# Patient Record
Sex: Male | Born: 1960
Health system: Southern US, Community
[De-identification: ages and names within clinical notes are randomized; demographics above are authoritative.]

## PROBLEM LIST (undated history)

## (undated) DIAGNOSIS — Z87442 Personal history of urinary calculi: Secondary | ICD-10-CM

## (undated) DIAGNOSIS — R350 Frequency of micturition: Secondary | ICD-10-CM

## (undated) DIAGNOSIS — Z872 Personal history of diseases of the skin and subcutaneous tissue: Secondary | ICD-10-CM

## (undated) DIAGNOSIS — N210 Calculus in bladder: Secondary | ICD-10-CM

## (undated) DIAGNOSIS — Z973 Presence of spectacles and contact lenses: Secondary | ICD-10-CM

## (undated) DIAGNOSIS — R319 Hematuria, unspecified: Secondary | ICD-10-CM

## (undated) DIAGNOSIS — R3915 Urgency of urination: Secondary | ICD-10-CM

## (undated) DIAGNOSIS — N200 Calculus of kidney: Secondary | ICD-10-CM

---
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
## 1994-09-24 HISTORY — PX: CYSTOSCOPY/RETROGRADE/URETEROSCOPY/STONE EXTRACTION WITH BASKET: SHX5317

## 2012-02-06 ENCOUNTER — Encounter (HOSPITAL_COMMUNITY): Payer: Self-pay | Admitting: *Deleted

## 2012-02-06 ENCOUNTER — Emergency Department (HOSPITAL_COMMUNITY)
Admission: EM | Admit: 2012-02-06 | Discharge: 2012-02-06 | Discharge status: Against Medical Advice | Payer: Self-pay | Attending: Emergency Medicine | Admitting: Emergency Medicine

## 2012-02-06 DIAGNOSIS — R109 Unspecified abdominal pain: Secondary | ICD-10-CM | POA: Insufficient documentation

## 2012-02-06 DIAGNOSIS — Z87442 Personal history of urinary calculi: Secondary | ICD-10-CM | POA: Insufficient documentation

## 2012-02-06 LAB — URINALYSIS, ROUTINE W REFLEX MICROSCOPIC
Glucose, UA: NEGATIVE mg/dL
Ketones, ur: NEGATIVE mg/dL
Leukocytes, UA: NEGATIVE
Urobilinogen, UA: 0.2 mg/dL (ref 0.0–1.0)

## 2012-02-06 LAB — URINE MICROSCOPIC-ADD ON

## 2012-02-06 NOTE — ED Notes (Signed)
Patient refused to stay and get further tx. States he can go to another hospital for tx.  Attempted to encourage pt to stay but he sign ama papers and continued to complain about hospital

## 2012-02-06 NOTE — ED Notes (Addendum)
Pt reports pain in left flank beginning about 7 pm tonight.  Pt does report history of kidney stones. Most recent in December. Reports taking one flomax and 2 Percocet with no relief.

## 2014-11-25 ENCOUNTER — Other Ambulatory Visit: Payer: Self-pay | Admitting: Urology

## 2014-11-26 ENCOUNTER — Encounter (HOSPITAL_COMMUNITY): Payer: Self-pay

## 2014-11-27 MED ORDER — HYDROMORPHONE HCL 1 MG/ML IJ SOLN
INTRAMUSCULAR | Status: AC
  Filled 2014-11-27: qty 1

## 2014-11-29 ENCOUNTER — Ambulatory Visit (HOSPITAL_COMMUNITY)
Admission: RE | Admit: 2014-11-29 | Discharge: 2014-11-29 | Disposition: A | Discharge status: Home / Self Care | Payer: 59 | Source: Ambulatory Visit | Attending: Urology | Admitting: Urology

## 2014-11-29 ENCOUNTER — Ambulatory Visit (HOSPITAL_COMMUNITY): Payer: 59

## 2014-11-29 ENCOUNTER — Encounter (HOSPITAL_COMMUNITY): Payer: Self-pay | Admitting: *Deleted

## 2014-11-29 ENCOUNTER — Encounter (HOSPITAL_COMMUNITY)
Admission: RE | Disposition: A | Discharge status: Home / Self Care | Payer: Self-pay | Source: Ambulatory Visit | Attending: Urology

## 2014-11-29 DIAGNOSIS — N201 Calculus of ureter: Secondary | ICD-10-CM | POA: Diagnosis not present

## 2014-11-29 DIAGNOSIS — Z79899 Other long term (current) drug therapy: Secondary | ICD-10-CM | POA: Insufficient documentation

## 2014-11-29 DIAGNOSIS — Z87442 Personal history of urinary calculi: Secondary | ICD-10-CM | POA: Insufficient documentation

## 2014-11-29 DIAGNOSIS — Z87891 Personal history of nicotine dependence: Secondary | ICD-10-CM | POA: Diagnosis not present

## 2014-11-29 HISTORY — PX: EXTRACORPOREAL SHOCK WAVE LITHOTRIPSY: SHX1557

## 2014-11-29 SURGERY — LITHOTRIPSY, ESWL
Anesthesia: LOCAL | Laterality: Right

## 2014-11-29 MED ORDER — SODIUM CHLORIDE 0.9 % IV SOLN
INTRAVENOUS | Status: DC
  Administered 2014-11-29: 07:00:00 via INTRAVENOUS

## 2014-11-29 MED ORDER — CIPROFLOXACIN HCL 500 MG PO TABS
500.0000 mg | ORAL_TABLET | ORAL | Status: AC
  Administered 2014-11-29: 500 mg via ORAL
  Filled 2014-11-29: qty 1

## 2014-11-29 MED ORDER — DIPHENHYDRAMINE HCL 25 MG PO CAPS
25.0000 mg | ORAL_CAPSULE | ORAL | Status: AC
  Administered 2014-11-29: 25 mg via ORAL
  Filled 2014-11-29: qty 1

## 2014-11-29 MED ORDER — DIAZEPAM 5 MG PO TABS
10.0000 mg | ORAL_TABLET | ORAL | Status: AC
  Administered 2014-11-29: 10 mg via ORAL
  Filled 2014-11-29: qty 2

## 2014-11-29 NOTE — Op Note (Signed)
See [facility] OP note scanned into chart. Also because of the size, density, location and other factors that cannot be anticipated I feel this will likely be a staged procedure. This fact supersedes any indication in the scanned [facility] operative note to the contrary.  

## 2014-11-29 NOTE — Discharge Instructions (Signed)

## 2014-11-29 NOTE — H&P (Signed)
istory of Present Illness Nephrolithaisis: He has a long history of nephrolithiasis dating back to 1991 when he underwent ureteroscopy by Dr. Reece Agar. He has passed multiple stones spontaneously. A CT scan done in 2011 showed no renal calculi present at that time. He subsequently passed a 6 mm stone in 2005.  Stone analysis: Calcium oxalate.  Stone risk analysis: His volume was noted to be excellent at over 3 L per day, but is urinary oxalate was elevated at 50, calcium at 236, and sodium high at 186. Citrate was low at 384.   He was prescribed K-Phos and potassium citrate and told to decrease his cola intake to no more than one per day.    Interval history: He experienced painless gross hematuria but then developed right flank pain and a CT scan done on 10/28/14 revealed a 7 mm stone with Hounsfield units of about 500 in his proximal right ureter with some dilatation of the collecting system. There was a single punctate stone in the left kidney and no right renal calculi. He tells me he has not been having any significant pain. He said he has had a couple of what he described as twinges but nothing severe. He has not seen his stone passed. He has however passed some small blood clots.     Past Medical History Problems  1. History of Abdominal pain, LLQ (left lower quadrant) (R10.32) 2. History of Abdominal pain, RLQ (right lower quadrant) (R10.31) 3. History of kidney stones (Z87.442) 4. History of Microscopic hematuria (R31.2)  Surgical History Problems  1. History of Hand Surgery 2. History of Lithotripsy 3. History of Tonsillectomy  Current Meds 1. Hydrocodone-Acetaminophen 10-325 MG Oral Tablet; TAKE 1 TO 2 TABLETS EVERY 6  HOURS AS NEEDED;  Therapy: [id number] to (Last Rx:04Feb2016) Ordered 2. Multi-Vitamin Oral Tablet;  Therapy: (Recorded:02Jul2010) to Recorded 3. Tamsulosin HCl - 0.4 MG Oral Capsule; TAKE 1 CAPSULE AT BEDTIME  Requested for:  [id number]; Last Rx:04Feb2016  Ordered  Allergies Medication  1. No Known Drug Allergies  Family History Problems  1. Family history of Family Health Status - Father's Age   67 2. Family history of Family Health Status - Mother's Age   27 3. Family history of Family Health Status Number Of Children   1 daughter -87 4. Family history of Nephrolithiasis : Father 5. Family history of Nephrolithiasis : Paternal Uncle 77. Family history of Nephrolithiasis : Paternal Grandfather  Social History Problems  1. Denied: History of Alcohol Use 2. Caffeine Use   5 per day 3. Former smoker [phone]) 4. Marital History - Currently Married 5. Occupation:   printing 6. Tobacco Use   smokes 1 ppd for 36 yrs   Review of Systems Genitourinary, constitutional, skin, eye, otolaryngeal, hematologic/lymphatic, cardiovascular, pulmonary, endocrine, musculoskeletal, gastrointestinal, neurological and psychiatric system(s) were reviewed and pertinent findings if present are noted and are otherwise negative.  Genitourinary: testicular pain, but no hematuria.  ENT: sinus problems.    Vital signs Height: 5 ft 9 in Weight: 180 lb  BMI Calculated: 26.58 BSA Calculated: 1.98 Blood Pressure: 118 / 69 Heart Rate: 67  Physical Exam Constitutional: Well nourished and well developed . No acute distress.  ENT:. The ears and nose are normal in appearance.  Neck: The appearance of the neck is normal and no neck mass is present.  Pulmonary: No respiratory distress and normal respiratory rhythm and effort.  Cardiovascular: Heart rate and rhythm are normal . No peripheral edema.  Abdomen: The abdomen is soft  and nontender. No masses are palpated. No CVA tenderness. No hernias are palpable. No hepatosplenomegaly noted.  Lymphatics: The femoral and inguinal nodes are not enlarged or tender.  Skin: Normal skin turgor, no visible rash and no visible skin lesions.  Neuro/Psych:. Mood and affect are appropriate.     The following  images/tracing/specimen were independently visualized:  KUB: His stone is again noted adjacent to the L3 spinous process on the right. It does not appear to be progressed significantly.  The following clinical lab reports were reviewed:  His urine had a few red cells today was otherwise unremarkable.    Assessment   We discussed the management of urinary stones. These options include observation, ureteroscopy, shockwave lithotripsy, and PCNL. We discussed which options are relevant to these particular stones. We discussed the natural history of stones as well as the complications of untreated stones and the impact on quality of life without treatment as well as with each of the above listed treatments. We also discussed the efficacy of each treatment in its ability to clear the stone burden. With any of these management options I discussed the signs and symptoms of infection and the need for emergent treatment should these be experienced. For each option we discussed the ability of each procedure to clear the patient of their stone burden.    For observation I described the risks which include but are not limited to silent renal damage, life-threatening infection, need for emergent surgery, failure to pass stone, and pain.    For ureteroscopy I described the risks which include heart attack, stroke, pulmonary embolus, death, bleeding, infection, damage to contiguous structures, positioning injury, ureteral stricture, ureteral avulsion, ureteral injury, need for ureteral stent, inability to perform ureteroscopy, need for an interval procedure, inability to clear stone burden, stent discomfort and pain.    For shockwave lithotripsy I described the risks which include arrhythmia, kidney contusion, kidney hemorrhage, need for transfusion, long-term risk of diabetes or hypertension, back discomfort, flank ecchymosis, flank abrasion, inability to break up stone, inability to pass stone fragments,  Steinstrasse, infection associated with obstructing stones, need for different surgical procedure, need for repeat shockwave lithotripsy, and death.    He's had ureteroscopy in the past. He like to try to avoid a stent. We therefore decided to proceed with lithotripsy.   Plan   He will be scheduled for lithotripsy of his right mid ureteral stone.

## 2016-03-30 LAB — PSA

## 2016-03-31 ENCOUNTER — Ambulatory Visit (INDEPENDENT_AMBULATORY_CARE_PROVIDER_SITE_OTHER): Payer: 59 | Admitting: Physician Assistant

## 2016-03-31 ENCOUNTER — Ambulatory Visit (INDEPENDENT_AMBULATORY_CARE_PROVIDER_SITE_OTHER): Payer: 59

## 2016-03-31 VITALS — BP 120/70 | HR 61 | Temp 97.3°F | Resp 18 | Ht 68.0 in | Wt 173.0 lb

## 2016-03-31 DIAGNOSIS — Z6826 Body mass index (BMI) 26.0-26.9, adult: Secondary | ICD-10-CM | POA: Insufficient documentation

## 2016-03-31 DIAGNOSIS — M79645 Pain in left finger(s): Secondary | ICD-10-CM | POA: Diagnosis not present

## 2016-03-31 DIAGNOSIS — L03012 Cellulitis of left finger: Secondary | ICD-10-CM

## 2016-03-31 DIAGNOSIS — N200 Calculus of kidney: Secondary | ICD-10-CM | POA: Insufficient documentation

## 2016-03-31 MED ORDER — DOXYCYCLINE HYCLATE 100 MG PO CAPS: 100.0000 mg | ORAL_CAPSULE | Freq: Two times a day (BID) | ORAL | Status: AC

## 2016-03-31 NOTE — Progress Notes (Signed)
Patient ID: Matthew Bradford, male     DOB: [birth year], 55 y.o.    MRN: [id number]  PCP: No PCP Per Patient  Chief Complaint  Patient presents with  . finger infection    left index finger-started 3 weeks ago    Subjective:    HPI  Presents for evaluation of possible finger infection.  In 1997 he sustained an injury to the LEFT index finger with a chainsaw. The injury was surgically repaired by Dr. Alphonzo Cruise.   3 weeks ago he developed a tender spot over the PIP of the same finger, along the scar of the previous injury. When a pustule appeared, he used a needle and drained it. Since then, the tenderness has waxed and waned, with redness and swelling.  1 week ago, while at the podiatrist for an unrelated issue, asked that he prescribe an antibiotic for the finger. He started cipro and has 2.5 days left. Seemed to help, but then worsening again yesterday. He describes it as a burning sensation along the radial aspect of the finger. He reports that the finger feels hot to the touch. THe area is painful with movement.  Epsom salts soaks, salt water soaks, Neosporin ointment seem to have helped.  No fever, chills. He has squeezed "pus" from the site on several occasions.    Prior to Admission medications   Medication Sig Start Date End Date Taking? Authorizing Provider  ciprofloxacin (CIPRO) 500 MG tablet Take 500 mg by mouth 2 (two) times daily.   Yes Historical Provider, MD  Neomycin-Bacitracin-Polymyxin (TRIPLE ANTIBIOTIC EX) Apply topically.   Yes Historical Provider, MD  HYDROcodone-acetaminophen (NORCO) 10-325 MG per tablet Take 1 tablet by mouth every 6 (six) hours as needed. Reported on 03/31/2016    Historical Provider, MD  Tamsulosin HCl (FLOMAX) 0.4 MG CAPS Take 0.4 mg by mouth daily. Reported on 03/31/2016    Historical Provider, MD     No Known Allergies   There are no active problems to display for this patient.    No family history on file.   Social History     Social History  . Marital Status: Single    Spouse Name: N/A  . Number of Children: N/A  . Years of Education: N/A   Occupational History  . Not on file.   Social History Main Topics  . Smoking status: Former Smoker    Quit date: 12/23/2009  . Smokeless tobacco: Not on file  . Alcohol Use: No  . Drug Use: No  . Sexual Activity: Not on file   Other Topics Concern  . Not on file   Social History Narrative        Review of Systems As above.      Objective:  Physical Exam  Constitutional: He is oriented to person, place, and time. He appears well-developed and well-nourished. He is active and cooperative. No distress.  BP 120/70 mmHg  Pulse 61  Temp(Src) 97.3 F (36.3 C) (Oral)  Resp 18  Ht 5\' 8"  (1.727 m)  Wt 173 lb (78.472 kg)  BMI 26.31 kg/m2  SpO2 96%   Eyes: Conjunctivae are normal.  Pulmonary/Chest: Effort normal.  Musculoskeletal:       Left hand: He exhibits decreased range of motion, tenderness, bony tenderness and swelling. He exhibits normal capillary refill. Lacerations: well healed surgical scar dorsal index finger. Normal sensation noted. Normal strength noted.       Hands: Neurological: He is alert and oriented to person, place, and time.  Psychiatric: He has a normal mood and affect. His speech is normal and behavior is normal.         Dg Finger Index Left  03/31/2016  CLINICAL DATA:  Drainage EXAM: LEFT INDEX FINGER 2+V COMPARISON:  None. FINDINGS: No acute fracture. No dislocation. Unremarkable soft tissues. Mild degenerative change of the DIP joint. IMPRESSION: No acute bony pathology. Electronically Signed   By: Marybelle Killings M.D.   On: 03/31/2016 11:44       Assessment & Plan:  1. Pain of finger of left hand Reassuring radiographs. - DG Finger Index Left; Future  2. Cellulitis of finger of left hand Stop Cipro. Start Doxycycline. If not significantly improved in 48 hours, re-evaluate. Would be nice to collect purulence for  culture if he is not improving. - doxycycline (VIBRAMYCIN) 100 MG capsule; Take 1 capsule (100 mg total) by mouth 2 (two) times daily.  Dispense: 20 capsule; Refill: 0   Fara Chute, PA-C Physician Assistant-Certified [facility]

## 2016-03-31 NOTE — Patient Instructions (Addendum)
STOP the ciprofloxacin. START the doxycycline.  Extreme caution with sun exposure, as it can cause increased sensitivity and burning. Take the doxycycline with food (though it is labeled to take on an empty stomach) to reduce the risk of nausea/vomiting.  Drink plenty of water.    IF you received an x-ray today, you will receive an invoice from [facility]. Please contact [facility] at [phone] with questions or concerns regarding your invoice.   IF you received labwork today, you will receive an invoice from [employer]. Please contact [facility] at [phone] with questions or concerns regarding your invoice.   Our billing staff will not be able to assist you with questions regarding bills from these companies.  You will be contacted with the lab results as soon as they are available. The fastest way to get your results is to activate your My Chart account. Instructions are located on the last page of this paperwork. If you have not heard from [place] regarding the results in 2 weeks, please contact this office.

## 2016-04-04 ENCOUNTER — Ambulatory Visit (INDEPENDENT_AMBULATORY_CARE_PROVIDER_SITE_OTHER): Payer: 59 | Admitting: Physician Assistant

## 2016-04-04 VITALS — BP 120/76 | HR 56 | Temp 98.0°F | Resp 16 | Wt 174.6 lb

## 2016-04-04 DIAGNOSIS — L03012 Cellulitis of left finger: Secondary | ICD-10-CM

## 2016-04-04 DIAGNOSIS — M79645 Pain in left finger(s): Secondary | ICD-10-CM

## 2016-04-04 MED ORDER — SULFAMETHOXAZOLE-TRIMETHOPRIM 800-160 MG PO TABS: 1.0000 | ORAL_TABLET | Freq: Two times a day (BID) | ORAL | Status: DC

## 2016-04-04 NOTE — Patient Instructions (Addendum)
ADD the second antibiotic. Take it with food. BE VERY CAREFUL OF THE SUN! Apply a warm compress (or do a warm soak) to the area for 15-20 minutes 2-4 times each day.    IF you received an x-ray today, you will receive an invoice from [facility]. Please contact [facility] at [phone] with questions or concerns regarding your invoice.   IF you received labwork today, you will receive an invoice from [employer]. Please contact [facility] at [phone] with questions or concerns regarding your invoice.   Our billing staff will not be able to assist you with questions regarding bills from these companies.  You will be contacted with the lab results as soon as they are available. The fastest way to get your results is to activate your My Chart account. Instructions are located on the last page of this paperwork. If you have not heard from [place] regarding the results in 2 weeks, please contact this office.

## 2016-04-04 NOTE — Progress Notes (Signed)
Patient ID: Matthew Bradford, male    DOB: [birth year], 55 y.o.   MRN: [id number]  PCP: No PCP Per Patient  Subjective:   Chief Complaint  Patient presents with  . Follow-up    finger    HPI Presents for evaluation of cellulitis of the LEFT index finger.  He initially presented 03/31/2016 with several weeks of waxing/waning pain, swelling and redness at the site of a healed remote injury requiring surgical repair in 1997. At a recent podiatry visit he requested and received a course of ciprofloxacin. He drained some purulence at home, which helped, but when the pain recurred, he came in. There was no purulence to collect and culture at the time, though the area was mildly swollen, with moderate erythema, and evidence of previous drainage. He was started on doxycycline and advised to return if no significant improvement in 48 hours.  Today he reports that initially he had improvement, and then yesterday developed a pustule, which he drained at home. When the pain was worse this morning, he came in.  No fever, chills. Tolerating doxycycline without difficulty.    Review of Systems As above.    Patient Active Problem List   Diagnosis Date Noted  . BMI 26.0-26.9,adult 03/31/2016  . Nephrolithiasis 03/31/2016     Prior to Admission medications   Medication Sig Start Date End Date Taking? Authorizing Provider  doxycycline (VIBRAMYCIN) 100 MG capsule Take 1 capsule (100 mg total) by mouth 2 (two) times daily. 03/31/16 04/10/16 Yes Raiford Fetterman, PA-C  HYDROcodone-acetaminophen (NORCO) 10-325 MG per tablet Take 1 tablet by mouth every 6 (six) hours as needed. Reported on 04/04/2016    Historical Provider, MD  Neomycin-Bacitracin-Polymyxin (TRIPLE ANTIBIOTIC EX) Apply topically. Reported on 04/04/2016    Historical Provider, MD  Tamsulosin HCl (FLOMAX) 0.4 MG CAPS Take 0.4 mg by mouth daily. Reported on 04/04/2016    Historical Provider, MD     Allergies  Allergen Reactions  .  Ibuprofen        Objective:  Physical Exam  Constitutional: He is oriented to person, place, and time. He appears well-developed and well-nourished. He is active and cooperative. No distress.  BP 120/76 mmHg  Pulse 56  Temp(Src) 98 F (36.7 C) (Oral)  Resp 16  Wt 174 lb 9.6 oz (79.198 kg)  SpO2 97%    Eyes: Conjunctivae are normal.  Pulmonary/Chest: Effort normal.  Musculoskeletal:       Left hand: He exhibits decreased range of motion, tenderness, bony tenderness and swelling. He exhibits normal capillary refill. Lacerations: well healed surgical scar dorsal index finger. Normal sensation noted. Normal strength noted.       Hands: Unable to express purulence. Needle edge used to lift the small central scab, but no purulence revealed. Small bleeding. Fluid was collected for culture.  Neurological: He is alert and oriented to person, place, and time.  Psychiatric: He has a normal mood and affect. His speech is normal and behavior is normal.           Assessment & Plan:   1. Cellulitis of finger of left hand 2. Pain of finger of left hand Continue doxycycline. ADD Septra DS. Await wound culture. Warm compresses/soaks. If not significantly improved in 48 hours, or if worsens, will need evaluation with hand specialist or Dr. Alphonzo Cruise, who performed the finger surgery in 1997. - WOUND CULTURE - sulfamethoxazole-trimethoprim (BACTRIM DS,SEPTRA DS) 800-160 MG tablet; Take 1 tablet by mouth 2 (two) times daily.  Dispense: 20 tablet;  Refill: 0    Fara Chute, PA-C Physician Assistant-Certified [facility]

## 2016-04-04 NOTE — Progress Notes (Signed)
   Subjective:    Patient ID: Matthew Bradford, male    DOB: 1961-03-24, 55 y.o.   MRN: [id number]  HPI  Matthew Bradford is here for f/u due to infection of his left index finger. Initially he injured the finger with a chainsaw in 1997, the injury was surgically repaired by Dr. Alphonzo Cruise. 3-4 weeks ago he developed tenderness along where the previous injury was, he was able to drain a pustule that appeared. A little over a week ago he was prescribed cipro for the finger by his podiatrist while there for an unrelated issue. He had erythema and tenderness over the joint and expressed pus from area on several occasions. He was told to stop the cipro on 7/8 and prescribed doxycycline. Told to come back if not improved in 48 hrs.  Today area over DIP of left index finger is erythematous, warm, and swollen with pain that the patient describes as burning. Patient lanced himself yesterday to relieve pain from buildup, drained thick yellowish pus. He is still able to bend the finger. Denies any fever, chills, or other signs of infection.   Current outpatient prescriptions:  .  doxycycline (VIBRAMYCIN) 100 MG capsule, Take 1 capsule (100 mg total) by mouth 2 (two) times daily., Disp: 20 capsule, Rfl: 0 .  HYDROcodone-acetaminophen (NORCO) 10-325 MG per tablet, Take 1 tablet by mouth every 6 (six) hours as needed. Reported on 04/04/2016, Disp: , Rfl:  .  Neomycin-Bacitracin-Polymyxin (TRIPLE ANTIBIOTIC EX), Apply topically. Reported on 04/04/2016, Disp: , Rfl:  .  sulfamethoxazole-trimethoprim (BACTRIM DS,SEPTRA DS) 800-160 MG tablet, Take 1 tablet by mouth 2 (two) times daily., Disp: 20 tablet, Rfl: 0 .  Tamsulosin HCl (FLOMAX) 0.4 MG CAPS, Take 0.4 mg by mouth daily. Reported on 04/04/2016, Disp: , Rfl:    Allergies  Allergen Reactions  . Ibuprofen     Review of Systems  All other systems reviewed and are negative.     Objective:   Physical Exam  Constitutional: He is oriented to person, place, and time. He  appears well-developed and well-nourished. No distress.  BP 120/76, Pulse 56, Temp: 98 F (36.7 C), Resp: 16     HENT:  Head: Normocephalic and atraumatic.  Neck: Neck supple.  Cardiovascular: Regular rhythm and normal heart sounds.  Bradycardia present.   Pulmonary/Chest: Effort normal and breath sounds normal.  Musculoskeletal:       Left hand: He exhibits tenderness and swelling. He exhibits normal range of motion.       Hands: No drainage easily expressed.  Neurological: He is alert and oriented to person, place, and time.  Skin: Skin is warm and dry.  Psychiatric: His behavior is normal. Judgment and thought content normal. Cognition and memory are normal.      Assessment & Plan:  1. Cellulitis of finger of left hand 2. Pain of finger of left hand - Erythema and swelling of LEFT index DIP - lanced with 25G needle and aerobic culture taken and sent - added bactrim 800-160 mg PO BID x10 days. Continue doxycycline 100 mg PO BID - warm compresses - if not improved in 24-48 hrs will refer to hand specialist  Kelly Rayburn PA-S 04/04/2016

## 2016-04-07 LAB — WOUND CULTURE: GRAM STAIN: NONE SEEN

## 2016-04-09 ENCOUNTER — Encounter: Payer: Self-pay | Admitting: Physician Assistant

## 2016-04-09 DIAGNOSIS — Z87442 Personal history of urinary calculi: Secondary | ICD-10-CM

## 2016-04-12 ENCOUNTER — Ambulatory Visit (INDEPENDENT_AMBULATORY_CARE_PROVIDER_SITE_OTHER): Payer: 59 | Admitting: Family Medicine

## 2016-04-12 ENCOUNTER — Telehealth: Payer: Self-pay

## 2016-04-12 VITALS — BP 130/70 | HR 59 | Temp 98.3°F | Resp 16 | Ht 68.0 in | Wt 174.0 lb

## 2016-04-12 DIAGNOSIS — L03012 Cellulitis of left finger: Secondary | ICD-10-CM

## 2016-04-12 DIAGNOSIS — IMO0001 Reserved for inherently not codable concepts without codable children: Secondary | ICD-10-CM

## 2016-04-12 MED ORDER — DOXYCYCLINE HYCLATE 100 MG PO CAPS: 100.0000 mg | ORAL_CAPSULE | Freq: Two times a day (BID) | ORAL | Status: DC

## 2016-04-12 MED ORDER — CEFTRIAXONE SODIUM 1 G IJ SOLR
1.0000 g | Freq: Once | INTRAMUSCULAR | Status: AC
  Administered 2016-04-12: 1 g via INTRAMUSCULAR

## 2016-04-12 MED ORDER — TRIAMCINOLONE ACETONIDE 0.5 % EX OINT: 1.0000 "application " | TOPICAL_OINTMENT | Freq: Two times a day (BID) | CUTANEOUS | Status: DC

## 2016-04-12 NOTE — Progress Notes (Signed)
Subjective:    Patient ID: Matthew Bradford, male    DOB: 05/31/1961, 55 y.o.   MRN: [id number]  04/12/2016  Follow-up (finger infection, left pointer finger)   HPI  This 55 y.o. male presents for follow-up of L second finger cellulitis. Evaluated on 03/31/16 by Harrison Mons; returned on 04/04/16 for recurrent redness along finger. Now presenting due to concern of lack of resolution.   1997 chainsaw injury L second distal finger.  Second week in June, nodule developed with pus.  Needle inserted into pustule at home.  Has been soaking in warm water and epson salt.  Every day, getting pus out of sore.  Brother in Sports coach who is a podiatrist prescribed Cipro for ten days.  After six days, no improvement.  Presented on 04/04/16; evaluated by Harrison Mons, PA-C who recommend Doxycycyline; stopped Cipro.  If no improvement, advised to return to clinic.  Three days later, drainage recurred; finished Doxycycline and added Bactrim.  Finished Doxycycline on Monday.  Wound healed over.  Yesterday, noticed getting red again.  This morning woke up with burning sensation; fingernail is burning.  Burning has been occurring intermittent the whole time. Burning improved for five days.  MIld burning that worsened this morning.  Finger is hot poker.  No fever/chills/sweats.  Sunlight causes horrible pain. Very concerned that going to need to have surgery or undergo amputation.  Very concerned by lack of improvement.   Review of Systems  Constitutional: Negative for chills, diaphoresis, fatigue and fever.  Musculoskeletal: Positive for joint swelling.  Skin: Positive for color change and wound. Negative for pallor and rash.  Neurological: Positive for numbness. Negative for weakness.    Past Medical History:  Diagnosis Date  . Kidney calculi    has passed 10 other stones on own    Allergies  Allergen Reactions  . Ibuprofen    Current Outpatient Prescriptions  Medication Sig Dispense Refill  .  HYDROcodone-acetaminophen (NORCO) 10-325 MG per tablet Take 1 tablet by mouth every 6 (six) hours as needed. Reported on 04/04/2016    . sulfamethoxazole-trimethoprim (BACTRIM DS,SEPTRA DS) 800-160 MG tablet Take 1 tablet by mouth 2 (two) times daily. 20 tablet 0  . Tamsulosin HCl (FLOMAX) 0.4 MG CAPS Take 0.4 mg by mouth daily. Reported on 04/04/2016    . doxycycline (VIBRAMYCIN) 100 MG capsule Take 1 capsule (100 mg total) by mouth 2 (two) times daily. 20 capsule 0  . HYDROcodone-acetaminophen (NORCO) 5-325 MG tablet Take 1 tablet by mouth every 6 (six) hours as needed. 40 tablet 0  . sulfamethoxazole-trimethoprim (BACTRIM DS) 800-160 MG tablet Take 1 tablet by mouth 2 (two) times daily. 56 tablet 0  . triamcinolone ointment (KENALOG) 0.5 % Apply 1 application topically 2 (two) times daily. 15 g 0   No current facility-administered medications for this visit.    Social History   Social History  . Marital status: Single    Spouse name: N/A  . Number of children: N/A  . Years of education: N/A   Occupational History  . Not on file.   Social History Main Topics  . Smoking status: Former Smoker    Quit date: 12/23/2009  . Smokeless tobacco: Current User    Types: Chew  . Alcohol use No  . Drug use: No  . Sexual activity: Not on file   Other Topics Concern  . Not on file   Social History Narrative  . No narrative on file   No family history on file.  Objective:    BP 130/70   Pulse (!) 59   Temp 98.3 F (36.8 C)   Resp 16   Ht 5\' 8"  (1.727 m)   Wt 174 lb (78.9 kg)   SpO2 98%   BMI 26.46 kg/m   Physical Exam  Constitutional: He appears well-developed and well-nourished. No distress.  Cardiovascular: Normal rate, regular rhythm, normal heart sounds and intact distal pulses.   Pulmonary/Chest: Effort normal and breath sounds normal. No respiratory distress. He has no wheezes. He has no rales.  Musculoskeletal:  L SECOND DIGIT: mild swelling along L PIP joint; mild  erythema lateral aspect of PIP region and distally at both ends; full extension and flexion of PIP and DIP joints.  No fluctuance; no pustules or vesicles; no streaking.   L HAND: full ROM digits with minimal pain.   Skin: Skin is warm and dry. No rash noted. He is not diaphoretic. No pallor.        Assessment & Plan:   1. Cellulitis of second finger, left    -persistent pain and burning despite overall benign exam. -s/p Rocephin in office. 0rx for Doxycycline provided. 0rx for Triamcinolone ointment provided to apply topically. -if no improvement with current treatment, refer to hand surgery. -RTC immediately for fever, redness, increasing pain.   No orders of the defined types were placed in this encounter.  Meds ordered this encounter  Medications  . DISCONTD: doxycycline (VIBRAMYCIN) 100 MG capsule    Sig: Take 1 capsule (100 mg total) by mouth 2 (two) times daily.    Dispense:  20 capsule    Refill:  0  . doxycycline (VIBRAMYCIN) 100 MG capsule    Sig: Take 1 capsule (100 mg total) by mouth 2 (two) times daily.    Dispense:  20 capsule    Refill:  0  . triamcinolone ointment (KENALOG) 0.5 %    Sig: Apply 1 application topically 2 (two) times daily.    Dispense:  15 g    Refill:  0  . cefTRIAXone (ROCEPHIN) injection 1 g    No Follow-up on file.    Samanth Mirkin Elayne Guerin, M.D. [facility] [address] [place], [place]  [id number] [phone] phone [phone] fax

## 2016-04-12 NOTE — Telephone Encounter (Signed)
Matthew Bradford, Matthew Bradford called this morning and I gave him his lab results. He stated that he had finished his antibiotics this past Monday and yet his finger is still burning. He wants to know what to do next. I read to him that you wanted him to see a hand specialist if symptoms persist.    Best callback number is [phone]

## 2016-04-12 NOTE — Telephone Encounter (Signed)
Exactly.  Thank you.

## 2016-04-12 NOTE — Patient Instructions (Signed)
     IF you received an x-ray today, you will receive an invoice from [facility]. Please contact [facility] at [phone] with questions or concerns regarding your invoice.   IF you received labwork today, you will receive an invoice from [facility]. Please contact [facility] at [phone] with questions or concerns regarding your invoice.   Our billing staff will not be able to assist you with questions regarding bills from these companies.  You will be contacted with the lab results as soon as they are available. The fastest way to get your results is to activate your My Chart account. Instructions are located on the last page of this paperwork. If you have not heard from us regarding the results in 2 weeks, please contact this office.      

## 2016-04-18 ENCOUNTER — Telehealth: Payer: Self-pay

## 2016-04-18 NOTE — Telephone Encounter (Signed)
Pt is still having issues with his finger even though he has been seen in our office three times already   Best number [phone]

## 2016-04-19 NOTE — Telephone Encounter (Signed)
Routed to Dr. Smith  

## 2016-04-25 NOTE — Telephone Encounter (Signed)
Please call patient for clarification.  What is still going on with his finger?  Is it red?  Does he have pain?  Does he have drainage?

## 2016-04-26 NOTE — Telephone Encounter (Signed)
Left message for pt to call back  °

## 2016-05-06 ENCOUNTER — Emergency Department (HOSPITAL_COMMUNITY)
Admission: EM | Admit: 2016-05-06 | Discharge: 2016-05-07 | Disposition: A | Discharge status: Home / Self Care | Payer: 59 | Attending: Dermatology | Admitting: Dermatology

## 2016-05-06 ENCOUNTER — Encounter (HOSPITAL_COMMUNITY): Payer: Self-pay | Admitting: Emergency Medicine

## 2016-05-06 DIAGNOSIS — R109 Unspecified abdominal pain: Secondary | ICD-10-CM | POA: Diagnosis present

## 2016-05-06 DIAGNOSIS — Z5321 Procedure and treatment not carried out due to patient leaving prior to being seen by health care provider: Secondary | ICD-10-CM | POA: Diagnosis not present

## 2016-05-06 DIAGNOSIS — Z87891 Personal history of nicotine dependence: Secondary | ICD-10-CM | POA: Diagnosis not present

## 2016-05-06 MED ORDER — ONDANSETRON 4 MG PO TBDP
4.0000 mg | ORAL_TABLET | Freq: Once | ORAL | Status: AC | PRN
  Administered 2016-05-06: 4 mg via ORAL
  Filled 2016-05-06: qty 1

## 2016-05-06 MED ORDER — FENTANYL CITRATE (PF) 100 MCG/2ML IJ SOLN
50.0000 ug | INTRAMUSCULAR | Status: DC | PRN
  Administered 2016-05-06: 50 ug via NASAL
  Filled 2016-05-06: qty 2

## 2016-05-06 NOTE — ED Triage Notes (Signed)
Patient presents for left flank pain x3 days. Reports Friday had hematuria, 4 episodes of emesis in last 24 hours. Denies fever.

## 2016-05-07 NOTE — Telephone Encounter (Signed)
Finger rotting off   Matthew Bradford  ONLY Demands call back today.   [phone]

## 2016-05-08 ENCOUNTER — Other Ambulatory Visit: Payer: Self-pay | Admitting: Orthopaedic Surgery

## 2016-05-09 ENCOUNTER — Ambulatory Visit (HOSPITAL_BASED_OUTPATIENT_CLINIC_OR_DEPARTMENT_OTHER): Payer: 59 | Admitting: Anesthesiology

## 2016-05-09 ENCOUNTER — Ambulatory Visit (HOSPITAL_BASED_OUTPATIENT_CLINIC_OR_DEPARTMENT_OTHER)
Admission: RE | Admit: 2016-05-09 | Discharge: 2016-05-09 | Disposition: A | Discharge status: Home / Self Care | Payer: 59 | Source: Ambulatory Visit | Attending: Orthopaedic Surgery | Admitting: Orthopaedic Surgery

## 2016-05-09 ENCOUNTER — Encounter (HOSPITAL_BASED_OUTPATIENT_CLINIC_OR_DEPARTMENT_OTHER)
Admission: RE | Disposition: A | Discharge status: Home / Self Care | Payer: Self-pay | Source: Ambulatory Visit | Attending: Orthopaedic Surgery

## 2016-05-09 ENCOUNTER — Encounter (HOSPITAL_BASED_OUTPATIENT_CLINIC_OR_DEPARTMENT_OTHER): Payer: Self-pay | Admitting: Anesthesiology

## 2016-05-09 DIAGNOSIS — L02512 Cutaneous abscess of left hand: Secondary | ICD-10-CM | POA: Insufficient documentation

## 2016-05-09 DIAGNOSIS — Z87891 Personal history of nicotine dependence: Secondary | ICD-10-CM | POA: Insufficient documentation

## 2016-05-09 DIAGNOSIS — Z79899 Other long term (current) drug therapy: Secondary | ICD-10-CM | POA: Diagnosis not present

## 2016-05-09 HISTORY — PX: I & D EXTREMITY: SHX5045

## 2016-05-09 SURGERY — IRRIGATION AND DEBRIDEMENT EXTREMITY
Anesthesia: Monitor Anesthesia Care | Site: Finger | Laterality: Left

## 2016-05-09 MED ORDER — DEXAMETHASONE SODIUM PHOSPHATE 10 MG/ML IJ SOLN
INTRAMUSCULAR | Status: AC
  Filled 2016-05-09: qty 1

## 2016-05-09 MED ORDER — MIDAZOLAM HCL 2 MG/2ML IJ SOLN
1.0000 mg | INTRAMUSCULAR | Status: DC | PRN
  Administered 2016-05-09: 2 mg via INTRAVENOUS

## 2016-05-09 MED ORDER — GLYCOPYRROLATE 0.2 MG/ML IV SOSY
PREFILLED_SYRINGE | INTRAVENOUS | Status: AC
  Filled 2016-05-09: qty 3

## 2016-05-09 MED ORDER — ROPIVACAINE HCL 5 MG/ML IJ SOLN
INTRAMUSCULAR | Status: DC | PRN
  Administered 2016-05-09: 150 mg via PERINEURAL

## 2016-05-09 MED ORDER — VANCOMYCIN HCL IN DEXTROSE 1-5 GM/200ML-% IV SOLN
INTRAVENOUS | Status: AC
  Filled 2016-05-09: qty 200

## 2016-05-09 MED ORDER — SULFAMETHOXAZOLE-TRIMETHOPRIM 800-160 MG PO TABS: 1.0000 | ORAL_TABLET | Freq: Two times a day (BID) | ORAL | 0 refills | Status: DC

## 2016-05-09 MED ORDER — FENTANYL CITRATE (PF) 100 MCG/2ML IJ SOLN
50.0000 ug | INTRAMUSCULAR | Status: DC | PRN
  Administered 2016-05-09: 100 ug via INTRAVENOUS

## 2016-05-09 MED ORDER — GLYCOPYRROLATE 0.2 MG/ML IJ SOLN
INTRAMUSCULAR | Status: DC | PRN
  Administered 2016-05-09: 0.2 mg via INTRAVENOUS

## 2016-05-09 MED ORDER — FENTANYL CITRATE (PF) 100 MCG/2ML IJ SOLN
INTRAMUSCULAR | Status: AC
  Filled 2016-05-09: qty 2

## 2016-05-09 MED ORDER — VANCOMYCIN HCL 1000 MG IV SOLR
INTRAVENOUS | Status: DC | PRN
  Administered 2016-05-09: 1000 mg via INTRAVENOUS

## 2016-05-09 MED ORDER — MIDAZOLAM HCL 2 MG/2ML IJ SOLN
INTRAMUSCULAR | Status: AC
  Filled 2016-05-09: qty 2

## 2016-05-09 MED ORDER — HYDROMORPHONE HCL 1 MG/ML IJ SOLN: 0.2500 mg | INTRAMUSCULAR | Status: DC | PRN

## 2016-05-09 MED ORDER — ONDANSETRON HCL 4 MG/2ML IJ SOLN
INTRAMUSCULAR | Status: DC | PRN
  Administered 2016-05-09: 4 mg via INTRAVENOUS

## 2016-05-09 MED ORDER — LIDOCAINE 2% (20 MG/ML) 5 ML SYRINGE
INTRAMUSCULAR | Status: AC
Start: 2016-05-09 — End: 2016-05-09
  Filled 2016-05-09: qty 5

## 2016-05-09 MED ORDER — FENTANYL CITRATE (PF) 100 MCG/2ML IJ SOLN
INTRAMUSCULAR | Status: DC | PRN
  Administered 2016-05-09 (×2): 50 ug via INTRAVENOUS

## 2016-05-09 MED ORDER — PROPOFOL 500 MG/50ML IV EMUL
INTRAVENOUS | Status: AC
  Filled 2016-05-09: qty 50

## 2016-05-09 MED ORDER — ONDANSETRON HCL 4 MG/2ML IJ SOLN
INTRAMUSCULAR | Status: AC
Start: 2016-05-09 — End: 2016-05-09
  Filled 2016-05-09: qty 2

## 2016-05-09 MED ORDER — LACTATED RINGERS IV SOLN
INTRAVENOUS | Status: DC
  Administered 2016-05-09 (×2): via INTRAVENOUS

## 2016-05-09 MED ORDER — PROMETHAZINE HCL 25 MG/ML IJ SOLN: 6.2500 mg | INTRAMUSCULAR | Status: DC | PRN

## 2016-05-09 MED ORDER — MIDAZOLAM HCL 5 MG/5ML IJ SOLN
INTRAMUSCULAR | Status: DC | PRN
  Administered 2016-05-09: 2 mg via INTRAVENOUS

## 2016-05-09 MED ORDER — HYDROCODONE-ACETAMINOPHEN 5-325 MG PO TABS: 1.0000 | ORAL_TABLET | Freq: Four times a day (QID) | ORAL | 0 refills | Status: DC | PRN

## 2016-05-09 SURGICAL SUPPLY — 67 items
BANDAGE ACE 3X5.8 VEL STRL LF (GAUZE/BANDAGES/DRESSINGS) IMPLANT
BANDAGE ACE 4X5 VEL STRL LF (GAUZE/BANDAGES/DRESSINGS) IMPLANT
BANDAGE COBAN STERILE 2 (GAUZE/BANDAGES/DRESSINGS) ×1 IMPLANT
BLADE SURG 15 STRL LF DISP TIS (BLADE) ×2 IMPLANT
BLADE SURG 15 STRL SS (BLADE) ×4
BNDG CMPR 9X4 STRL LF SNTH (GAUZE/BANDAGES/DRESSINGS) ×1
BNDG COHESIVE 1X5 TAN STRL LF (GAUZE/BANDAGES/DRESSINGS) IMPLANT
BNDG CONFORM 2 STRL LF (GAUZE/BANDAGES/DRESSINGS) ×2 IMPLANT
BNDG ESMARK 4X9 LF (GAUZE/BANDAGES/DRESSINGS) ×1 IMPLANT
BRUSH SCRUB EZ PLAIN DRY (MISCELLANEOUS) ×2 IMPLANT
CANISTER SUCT 1200ML W/VALVE (MISCELLANEOUS) ×2 IMPLANT
CORDS BIPOLAR (ELECTRODE) ×2 IMPLANT
COVER BACK TABLE 60X90IN (DRAPES) ×2 IMPLANT
CUFF TOURNIQUET SINGLE 18IN (TOURNIQUET CUFF) ×1 IMPLANT
DECANTER SPIKE VIAL GLASS SM (MISCELLANEOUS) IMPLANT
DRAIN PENROSE 1/2X12 LTX STRL (WOUND CARE) IMPLANT
DRAIN PENROSE 1/4X12 LTX STRL (WOUND CARE) IMPLANT
DRAPE EXTREMITY T 121X128X90 (DRAPE) ×2 IMPLANT
DRAPE IMP U-DRAPE 54X76 (DRAPES) ×2 IMPLANT
DRAPE SURG 17X23 STRL (DRAPES) IMPLANT
DRSG EMULSION OIL 3X3 NADH (GAUZE/BANDAGES/DRESSINGS) ×1 IMPLANT
DURAPREP 26ML APPLICATOR (WOUND CARE) ×1 IMPLANT
GAUZE PACKING IODOFORM 1/4X15 (GAUZE/BANDAGES/DRESSINGS) IMPLANT
GAUZE SPONGE 4X4 12PLY STRL (GAUZE/BANDAGES/DRESSINGS) ×2 IMPLANT
GAUZE XEROFORM 1X8 LF (GAUZE/BANDAGES/DRESSINGS) ×1 IMPLANT
GLOVE BIO SURGEON STRL SZ 6.5 (GLOVE) ×1 IMPLANT
GLOVE BIOGEL PI IND STRL 7.0 (GLOVE) IMPLANT
GLOVE BIOGEL PI IND STRL 8 (GLOVE) IMPLANT
GLOVE BIOGEL PI INDICATOR 7.0 (GLOVE) ×1
GLOVE BIOGEL PI INDICATOR 8 (GLOVE) ×1
GLOVE SKINSENSE NS SZ7.5 (GLOVE) ×1
GLOVE SKINSENSE STRL SZ7.5 (GLOVE) ×1 IMPLANT
GLOVE SURG SYN 7.5  E (GLOVE) ×1
GLOVE SURG SYN 7.5 E (GLOVE) ×1 IMPLANT
GLOVE SURG SYN 7.5 PF PI (GLOVE) ×1 IMPLANT
GOWN STRL REIN XL XLG (GOWN DISPOSABLE) ×2 IMPLANT
GOWN STRL REUS W/ TWL LRG LVL3 (GOWN DISPOSABLE) ×1 IMPLANT
GOWN STRL REUS W/TWL LRG LVL3 (GOWN DISPOSABLE) ×2
LOOP VESSEL MAXI BLUE (MISCELLANEOUS) IMPLANT
NEEDLE HYPO 22GX1.5 SAFETY (NEEDLE) IMPLANT
NS IRRIG 1000ML POUR BTL (IV SOLUTION) ×7 IMPLANT
PACK BASIN DAY SURGERY FS (CUSTOM PROCEDURE TRAY) ×2 IMPLANT
PAD CAST 3X4 CTTN HI CHSV (CAST SUPPLIES) IMPLANT
PAD CAST 4YDX4 CTTN HI CHSV (CAST SUPPLIES) IMPLANT
PADDING CAST COTTON 3X4 STRL (CAST SUPPLIES)
PADDING CAST COTTON 4X4 STRL (CAST SUPPLIES)
PADDING CAST SYNTHETIC 2 (CAST SUPPLIES)
PADDING CAST SYNTHETIC 2X4 NS (CAST SUPPLIES) IMPLANT
PADDING CAST SYNTHETIC 3 NS LF (CAST SUPPLIES)
PADDING CAST SYNTHETIC 3X4 NS (CAST SUPPLIES) IMPLANT
RUBBERBAND STERILE (MISCELLANEOUS) ×2 IMPLANT
SHEET MEDIUM DRAPE 40X70 STRL (DRAPES) IMPLANT
SLEEVE SCD COMPRESS KNEE MED (MISCELLANEOUS) ×1 IMPLANT
SLING ARM FOAM STRAP LRG (SOFTGOODS) ×1 IMPLANT
SPONGE LAP 18X18 X RAY DECT (DISPOSABLE) IMPLANT
STAPLER VISISTAT (STAPLE) IMPLANT
STOCKINETTE 4X48 STRL (DRAPES) ×1 IMPLANT
SUT ETHILON 3 0 PS 1 (SUTURE) IMPLANT
SUT ETHILON 4 0 PS 2 18 (SUTURE) ×1 IMPLANT
SUT VICRYL 4-0 PS2 18IN ABS (SUTURE) IMPLANT
SWAB COLLECTION DEVICE MRSA (MISCELLANEOUS) ×3 IMPLANT
SWAB CULTURE ESWAB REG 1ML (MISCELLANEOUS) ×2 IMPLANT
SYR BULB 3OZ (MISCELLANEOUS) ×2 IMPLANT
SYR CONTROL 10ML LL (SYRINGE) IMPLANT
TOWEL OR 17X24 6PK STRL BLUE (TOWEL DISPOSABLE) ×2 IMPLANT
TRAY DSU PREP LF (CUSTOM PROCEDURE TRAY) ×2 IMPLANT
UNDERPAD 30X30 (UNDERPADS AND DIAPERS) ×2 IMPLANT

## 2016-05-09 NOTE — Op Note (Signed)
   Date of Surgery: 05/09/2016  INDICATIONS: Matthew Bradford is a 55 y.o.-year-old male with a left index finger abscess that's failed oral antibiotics;  The patient did consent to the procedure after discussion of the risks and benefits.  PREOPERATIVE DIAGNOSIS: Left index finger abscess  POSTOPERATIVE DIAGNOSIS: Same.  PROCEDURE: Incision and drainage of left index finger abscess, complicated  SURGEON: N. Eduard Roux, M.D.  ASSIST: none.  ANESTHESIA:  regional  IV FLUIDS AND URINE: See anesthesia.  ESTIMATED BLOOD LOSS: minimal mL.  IMPLANTS: none  DRAINS: none  COMPLICATIONS: None.  DESCRIPTION OF PROCEDURE: The patient was brought to the operating room and placed supine on the operating table.  The patient had been signed prior to the procedure and this was documented. The patient had the anesthesia placed by the anesthesiologist.  A time-out was performed to confirm that this was the correct patient, site, side and location. The patient did receive antibiotics prior to the incision and was re-dosed during the procedure as needed at indicated intervals.  A tourniquet was placed.  The patient had the operative extremity prepped and draped in the standard surgical fashion.    I made a longitudinal incision over the abscess. There was a small amount of frank pus. This was cultured. Vancomycin was given after the cultures. I then performed excisional debridement of the skin, subcutaneous tissue of all infected and necrotic nonviable tissue back to a healthy border. I then thoroughly irrigated the wound with 6 L normal saline. The skin was loosely approximated with 4-0 nylon. Bactroban ointment was applied. Sterile dressings were placed. Patient tolerated procedure well and no immediate competitions.  POSTOPERATIVE PLAN: The patient will be placed on 4 weeks of oral Bactrim.  Matthew Cecil, MD [place] 12:44 PM

## 2016-05-09 NOTE — Anesthesia Postprocedure Evaluation (Signed)
Anesthesia Post Note  Patient: Matthew Bradford  Procedure(s) Performed: Procedure(s) (LRB): IRRIGATION AND DEBRIDEMENT LEFT INDEX FINGER (Left)  Patient location during evaluation: PACU Anesthesia Type: MAC Level of consciousness: awake and alert Pain management: pain level controlled Vital Signs Assessment: post-procedure vital signs reviewed and stable Respiratory status: spontaneous breathing and respiratory function stable Cardiovascular status: stable Anesthetic complications: no    Last Vitals:  Vitals:   05/09/16 1315 05/09/16 1323  BP: (!) 129/93   Pulse: 71 61  Resp: 18 14  Temp:      Last Pain:  Vitals:   05/09/16 1315  TempSrc:   PainSc: 0-No pain                 Modestine Scherzinger DANIEL

## 2016-05-09 NOTE — Discharge Instructions (Signed)
Postoperative instructions:  Weightbearing: as tolerated  Dressing instructions: Keep your dressing and/or splint clean and dry at all times.  It will be removed at your first post-operative appointment.  Your stitches and/or staples will be removed at this visit.  Incision instructions:  Do not soak your incision for 3 weeks after surgery.  If the incision gets wet, pat dry and do not scrub the incision.  Pain control:  You have been given a prescription to be taken as directed for post-operative pain control.  In addition, elevate the operative extremity above the heart at all times to prevent swelling and throbbing pain.  Take over-the-counter Colace, 100mg  by mouth twice a day while taking narcotic pain medications to help prevent constipation.  Follow up appointments: 1) 10-14 days for suture removal and wound check. 2) Dr. Erlinda Hong as scheduled.   -------------------------------------------------------------------------------------------------------------  After Surgery Pain Control:  After your surgery, post-surgical discomfort or pain is likely. This discomfort can last several days to a few weeks. At certain times of the day your discomfort may be more intense.  Did you receive a nerve block?  A nerve block can provide pain relief for one hour to two days after your surgery. As long as the nerve block is working, you will experience little or no sensation in the area the surgeon operated on.  As the nerve block wears off, you will begin to experience pain or discomfort. It is very important that you begin taking your prescribed pain medication before the nerve block fully wears off. Treating your pain at the first sign of the block wearing off will ensure your pain is better controlled and more tolerable when full-sensation returns. Do not wait until the pain is intolerable, as the medicine will be less effective. It is better to treat pain in advance than to try and catch up.  General  Anesthesia:  If you did not receive a nerve block during your surgery, you will need to start taking your pain medication shortly after your surgery and should continue to do so as prescribed by your surgeon.  Pain Medication:  Most commonly we prescribe Vicodin and Percocet for post-operative pain. Both of these medications contain a combination of acetaminophen (Tylenol) and a narcotic to help control pain.   It takes between 30 and 45 minutes before pain medication starts to work. It is important to take your medication before your pain level gets too intense.   Nausea is a common side effect of many pain medications. You will want to eat something before taking your pain medicine to help prevent nausea.   If you are taking a prescription pain medication that contains acetaminophen, we recommend that you do not take additional over the counter acetaminophen (Tylenol).  Other pain relieving options:   Using a cold pack to ice the affected area a few times a day (15 to 20 minutes at a time) can help to relieve pain, reduce swelling and bruising.   Elevation of the affected area can also help to reduce pain and swelling.     Regional Anesthesia Blocks  1. Numbness or the inability to move the "blocked" extremity may last from 3-48 hours after placement. The length of time depends on the medication injected and your individual response to the medication. If the numbness is not going away after 48 hours, call your surgeon.  2. The extremity that is blocked will need to be protected until the numbness is gone and the  Strength has returned.  Because you cannot feel it, you will need to take extra care to avoid injury. Because it may be weak, you may have difficulty moving it or using it. You may not know what position it is in without looking at it while the block is in effect.  3. For blocks in the legs and feet, returning to weight bearing and walking needs to be done carefully. You will  need to wait until the numbness is entirely gone and the strength has returned. You should be able to move your leg and foot normally before you try and bear weight or walk. You will need someone to be with you when you first try to ensure you do not fall and possibly risk injury.  4. Bruising and tenderness at the needle site are common side effects and will resolve in a few days.  5. Persistent numbness or new problems with movement should be communicated to the surgeon or the [facility] [phone] [place] [phone]).  Post Anesthesia Home Care Instructions  Activity: Get plenty of rest for the remainder of the day. A responsible adult should stay with you for 24 hours following the procedure.  For the next 24 hours, DO NOT: -Drive a car -Paediatric nurse -Drink alcoholic beverages -Take any medication unless instructed by your physician -Make any legal decisions or sign important papers.  Meals: Start with liquid foods such as gelatin or soup. Progress to regular foods as tolerated. Avoid greasy, spicy, heavy foods. If nausea and/or vomiting occur, drink only clear liquids until the nausea and/or vomiting subsides. Call your physician if vomiting continues.  Special Instructions/Symptoms: Your throat may feel dry or sore from the anesthesia or the breathing tube placed in your throat during surgery. If this causes discomfort, gargle with warm salt water. The discomfort should disappear within 24 hours.  If you had a scopolamine patch placed behind your ear for the management of post- operative nausea and/or vomiting:  1. The medication in the patch is effective for 72 hours, after which it should be removed.  Wrap patch in a tissue and discard in the trash. Wash hands thoroughly with soap and water. 2. You may remove the patch earlier than 72 hours if you experience unpleasant side effects which may include dry mouth, dizziness or visual  disturbances. 3. Avoid touching the patch. Wash your hands with soap and water after contact with the patch.

## 2016-05-09 NOTE — Anesthesia Procedure Notes (Signed)
Procedure Name: MAC Date/Time: 05/09/2016 12:03 PM Performed by: Marrianne Mood Pre-anesthesia Checklist: Patient identified, Timeout performed, Emergency Drugs available, Suction available and Patient being monitored Patient Re-evaluated:Patient Re-evaluated prior to inductionOxygen Delivery Method: Simple face mask

## 2016-05-09 NOTE — Progress Notes (Signed)
Assisted Dr. Singer with left, ultrasound guided, supraclavicular block. Side rails up, monitors on throughout procedure. See vital signs in flow sheet. Tolerated Procedure well. 

## 2016-05-09 NOTE — Anesthesia Preprocedure Evaluation (Addendum)
Anesthesia Evaluation  Patient identified by MRN, date of birth, ID band Patient awake    Reviewed: Allergy & Precautions, NPO status , Patient's Chart, lab work & pertinent test results  Airway Mallampati: II  TM Distance: >3 FB Neck ROM: Full    Dental  (+) Teeth Intact, Dental Advisory Given   Pulmonary neg pulmonary ROS, former smoker,    Pulmonary exam normal        Cardiovascular negative cardio ROS Normal cardiovascular exam     Neuro/Psych negative neurological ROS  negative psych ROS   GI/Hepatic negative GI ROS, Neg liver ROS,   Endo/Other  negative endocrine ROS  Renal/GU negative Renal ROS  negative genitourinary   Musculoskeletal negative musculoskeletal ROS (+)   Abdominal   Peds negative pediatric ROS (+)  Hematology negative hematology ROS (+)   Anesthesia Other Findings   Reproductive/Obstetrics negative OB ROS                             Anesthesia Physical Anesthesia Plan  ASA: I  Anesthesia Plan: MAC and Regional   Post-op Pain Management:    Induction: Intravenous  Airway Management Planned: Natural Airway and Simple Face Mask  Additional Equipment:   Intra-op Plan:   Post-operative Plan:   Informed Consent: I have reviewed the patients History and Physical, chart, labs and discussed the procedure including the risks, benefits and alternatives for the proposed anesthesia with the patient or authorized representative who has indicated his/her understanding and acceptance.   Dental advisory given  Plan Discussed with: CRNA, Anesthesiologist and Surgeon  Anesthesia Plan Comments:        Anesthesia Quick Evaluation

## 2016-05-09 NOTE — Transfer of Care (Signed)
Immediate Anesthesia Transfer of Care Note  Patient: Roi R Alia  Procedure(s) Performed: Procedure(s): IRRIGATION AND DEBRIDEMENT LEFT INDEX FINGER (Left)  Patient Location: PACU  Anesthesia Type:Regional  Level of Consciousness: awake and patient cooperative  Airway & Oxygen Therapy: Patient Spontanous Breathing and Patient connected to face mask oxygen  Post-op Assessment: Report given to RN and Post -op Vital signs reviewed and stable  Post vital signs: Reviewed and stable  Last Vitals:  Vitals:   05/09/16 1100 05/09/16 1115  BP: 139/76 125/83  Pulse: (!) 52 (!) 54  Resp: 15 (!) 24  Temp:      Last Pain:  Vitals:   05/09/16 1017  TempSrc: Oral  PainSc: 8          Complications: No apparent anesthesia complications

## 2016-05-09 NOTE — H&P (Signed)
    PREOPERATIVE H&P  Chief Complaint: left index finger abscess  HPI: Matthew Bradford is a 55 y.o. male who presents for surgical treatment of left index finger abscess.  He denies any changes in medical history.  Past Medical History:  Diagnosis Date  . Kidney calculi    has passed 10 other stones on own   Past Surgical History:  Procedure Laterality Date  . CYSTOSCOPY  1996   with stone extraction  . [place]  . TONSILLECTOMY     Social History   Social History  . Marital status: Single    Spouse name: N/A  . Number of children: N/A  . Years of education: N/A   Social History Main Topics  . Smoking status: Former Smoker    Quit date: 12/23/2009  . Smokeless tobacco: Current User    Types: Chew  . Alcohol use No  . Drug use: No  . Sexual activity: Not Asked   Other Topics Concern  . None   Social History Narrative  . None   History reviewed. No pertinent family history. Allergies  Allergen Reactions  . Ibuprofen    Prior to Admission medications   Medication Sig Start Date End Date Taking? Authorizing Provider  doxycycline (VIBRAMYCIN) 100 MG capsule Take 1 capsule (100 mg total) by mouth 2 (two) times daily. 04/12/16  Yes Wardell Honour, MD  HYDROcodone-acetaminophen (NORCO) 10-325 MG per tablet Take 1 tablet by mouth every 6 (six) hours as needed. Reported on 04/04/2016   Yes Historical Provider, MD  Neomycin-Bacitracin-Polymyxin (TRIPLE ANTIBIOTIC EX) Apply topically. Reported on 04/12/2016   Yes Historical Provider, MD  Tamsulosin HCl (FLOMAX) 0.4 MG CAPS Take 0.4 mg by mouth daily. Reported on 04/04/2016   Yes Historical Provider, MD  triamcinolone ointment (KENALOG) 0.5 % Apply 1 application topically 2 (two) times daily. 04/12/16  Yes Wardell Honour, MD  sulfamethoxazole-trimethoprim (BACTRIM DS,SEPTRA DS) 800-160 MG tablet Take 1 tablet by mouth 2 (two) times daily. 04/04/16   Chelle Jeffery, PA-C     Positive ROS: All other systems have been  reviewed and were otherwise negative with the exception of those mentioned in the HPI and as above.  Physical Exam: General: Alert, no acute distress Cardiovascular: No pedal edema Respiratory: No cyanosis, no use of accessory musculature GI: abdomen soft Skin: No lesions in the area of chief complaint Neurologic: Sensation intact distally Psychiatric: Patient is competent for consent with normal mood and affect Lymphatic: no lymphedema  MUSCULOSKELETAL: exam stable  Assessment: left index finger abscess  Plan: Plan for Procedure(s): IRRIGATION AND DEBRIDEMENT LEFT INDEX FINGER  The risks benefits and alternatives were discussed with the patient including but not limited to the risks of nonoperative treatment, versus surgical intervention including infection, bleeding, nerve injury,  blood clots, cardiopulmonary complications, morbidity, mortality, among others, and they were willing to proceed.   Marianna Payment, MD   05/09/2016 11:54 AM

## 2016-05-09 NOTE — Anesthesia Procedure Notes (Signed)
Anesthesia Regional Block:  Supraclavicular block  Pre-Anesthetic Checklist: ,, timeout performed, Correct Patient, Correct Site, Correct Laterality, Correct Procedure,, site marked, risks and benefits discussed, Surgical consent,  Pre-op evaluation,  At surgeon's request and post-op pain management  Laterality: Left  Prep: chloraprep       Needles:  Injection technique: Single-shot  Needle Type: Echogenic Stimulator Needle     Needle Length: 5cm 5 cm Needle Gauge: 22 and 22 G    Additional Needles:  Procedures: ultrasound guided (picture in chart) and nerve stimulator Supraclavicular block  Nerve Stimulator or Paresthesia:  Response: bicep contraction, 0.48 mA,   Additional Responses:   Narrative:  Start time: 05/09/2016 11:08 AM End time: 05/09/2016 11:18 AM Injection made incrementally with aspirations every 5 mL.  Performed by: Personally   Additional Notes: Functioning IV was confirmed and monitors applied.  A 8mm 22ga echogenic arrow stimulator was used. Sterile prep and drape,hand hygiene and sterile gloves were used.Ultrasound guidance: relevent anatomy identified, needle position confirmed, local anesthetic spread visualized around nerve(s)., vascular puncture avoided.  Image printed for medical record.  Negative aspiration and negative test dose prior to incremental administration of local anesthetic. The patient tolerated the procedure well.

## 2016-05-10 ENCOUNTER — Encounter (HOSPITAL_BASED_OUTPATIENT_CLINIC_OR_DEPARTMENT_OTHER): Payer: Self-pay | Admitting: Orthopaedic Surgery

## 2016-05-12 LAB — AEROBIC CULTURE W GRAM STAIN (SUPERFICIAL SPECIMEN)

## 2016-05-12 LAB — AEROBIC CULTURE  (SUPERFICIAL SPECIMEN)

## 2016-05-14 LAB — ANAEROBIC CULTURE

## 2016-05-14 LAB — AEROBIC/ANAEROBIC CULTURE (SURGICAL/DEEP WOUND)

## 2016-05-14 LAB — AEROBIC/ANAEROBIC CULTURE W GRAM STAIN (SURGICAL/DEEP WOUND)

## 2016-06-25 ENCOUNTER — Ambulatory Visit (INDEPENDENT_AMBULATORY_CARE_PROVIDER_SITE_OTHER): Payer: 59 | Admitting: Orthopaedic Surgery

## 2016-08-14 ENCOUNTER — Telehealth (INDEPENDENT_AMBULATORY_CARE_PROVIDER_SITE_OTHER): Payer: Self-pay | Admitting: Orthopaedic Surgery

## 2016-08-14 NOTE — Telephone Encounter (Signed)
Mr. Matthew Bradford walked into the office with a question regarding a bill. He said a statement he received appears to have a bill regarding his foot when his finger is what the surgery was performed on. Below is what the statement says...  "8.16.17 I&D BELOW FASCIA FOOT 1 BURSAL SPACE"  The forefinger on his left hand is where the surgery was performed. He's wondering if the surgery was coded correctly or incorrectly.  I gave him the phone number to our Billing Department but informed him that I'd send this message to you as well.  Pt ph# [phone] Thank you.

## 2016-08-14 NOTE — Telephone Encounter (Signed)
See message below °

## 2016-08-14 NOTE — Telephone Encounter (Signed)
I did put in the wrong code.  May be too late to change now.  The correct code is [phone].

## 2016-08-14 NOTE — Telephone Encounter (Signed)
Do you know if this can this be corrected ?

## 2016-08-23 NOTE — Telephone Encounter (Signed)
Please correct this charge

## 2016-08-26 ENCOUNTER — Emergency Department (HOSPITAL_COMMUNITY): Payer: 59

## 2016-08-26 ENCOUNTER — Emergency Department (HOSPITAL_COMMUNITY)
Admission: EM | Admit: 2016-08-26 | Discharge: 2016-08-26 | Disposition: A | Discharge status: Home / Self Care | Payer: 59 | Attending: Emergency Medicine | Admitting: Emergency Medicine

## 2016-08-26 DIAGNOSIS — N200 Calculus of kidney: Secondary | ICD-10-CM | POA: Diagnosis not present

## 2016-08-26 DIAGNOSIS — N210 Calculus in bladder: Secondary | ICD-10-CM

## 2016-08-26 DIAGNOSIS — R319 Hematuria, unspecified: Secondary | ICD-10-CM | POA: Diagnosis present

## 2016-08-26 DIAGNOSIS — Z87891 Personal history of nicotine dependence: Secondary | ICD-10-CM | POA: Insufficient documentation

## 2016-08-26 LAB — URINALYSIS, ROUTINE W REFLEX MICROSCOPIC
GLUCOSE, UA: NEGATIVE mg/dL
KETONES UR: 15 mg/dL — AB
NITRITE: NEGATIVE
PH: 5 (ref 5.0–8.0)
Protein, ur: 100 mg/dL — AB
SPECIFIC GRAVITY, URINE: 1.027 (ref 1.005–1.030)

## 2016-08-26 LAB — CBC WITH DIFFERENTIAL/PLATELET
Basophils Absolute: 0 10*3/uL (ref 0.0–0.1)
Basophils Relative: 0 %
Eosinophils Absolute: 0 10*3/uL (ref 0.0–0.7)
Eosinophils Relative: 1 %
HEMATOCRIT: 41.6 % (ref 39.0–52.0)
HEMOGLOBIN: 15 g/dL (ref 13.0–17.0)
LYMPHS PCT: 27 %
Lymphs Abs: 1.4 10*3/uL (ref 0.7–4.0)
MCH: 32.8 pg (ref 26.0–34.0)
MCHC: 36.1 g/dL — AB (ref 30.0–36.0)
MCV: 91 fL (ref 78.0–100.0)
MONO ABS: 0.4 10*3/uL (ref 0.1–1.0)
MONOS PCT: 7 %
NEUTROS ABS: 3.4 10*3/uL (ref 1.7–7.7)
NEUTROS PCT: 65 %
Platelets: 177 10*3/uL (ref 150–400)
RBC: 4.57 MIL/uL (ref 4.22–5.81)
RDW: 12.2 % (ref 11.5–15.5)
WBC: 5.2 10*3/uL (ref 4.0–10.5)

## 2016-08-26 LAB — BASIC METABOLIC PANEL
ANION GAP: 9 (ref 5–15)
BUN: 16 mg/dL (ref 6–20)
CALCIUM: 9.5 mg/dL (ref 8.9–10.3)
CHLORIDE: 109 mmol/L (ref 101–111)
CO2: 22 mmol/L (ref 22–32)
CREATININE: 1.21 mg/dL (ref 0.61–1.24)
GFR calc non Af Amer: >60 mL/min (ref >60)
GLUCOSE: 99 mg/dL (ref 65–99)
Potassium: 4.1 mmol/L (ref 3.5–5.1)
Sodium: 140 mmol/L (ref 135–145)

## 2016-08-26 LAB — URINE MICROSCOPIC-ADD ON: WBC UA: NONE SEEN WBC/hpf (ref 0–5)

## 2016-08-26 MED ORDER — HYDROCODONE-ACETAMINOPHEN 5-325 MG PO TABS: 2.0000 | ORAL_TABLET | Freq: Four times a day (QID) | ORAL | 0 refills | Status: DC | PRN

## 2016-08-26 NOTE — ED Notes (Signed)
ED Provider at bedside. 

## 2016-08-26 NOTE — ED Triage Notes (Signed)
Pt. Stated, Im having urinary frequency, and blood in my urine, started last Tuesday.

## 2016-08-26 NOTE — ED Provider Notes (Signed)
[facility] DEPT Provider Note   CSN: [id number] Arrival date & time: 08/26/16  1237     History   Chief Complaint Chief Complaint  Patient presents with  . Hematuria  . Urinary Frequency  . Testicle Pain    HPI Matthew Bradford is a 55 y.o. male.  Patient is a 12 are old male with a history of kidney stones status post lithotripsy and stone extraction presenting today with persistent and worsening hematuria. Patient states approximately 2 weeks ago he started having hematuria and frequency. He was seen by urology and at that time had a normal ultrasound was scheduled for follow-up with MD in December to ensure he did not have bladder stones.  All this week patient has had gradual worsening hematuria with urgency. Today approximately 4 hours before arrival he started to develop left flank and testicle pain 5 out of 10 and dull and achy in nature. He states the pain is not as bad as a typical kidney stone. He denies any fever, vomiting or GI complaints. He had mild burning with urination today. No recent medication changes.   The history is provided by the patient.  Hematuria   Urinary Frequency   Testicle Pain     Past Medical History:  Diagnosis Date  . Kidney calculi    has passed 10 other stones on own    Patient Active Problem List   Diagnosis Date Noted  . History of nephrolithiasis 04/09/2016  . BMI 26.0-26.9,adult 03/31/2016  . Nephrolithiasis 03/31/2016    Past Surgical History:  Procedure Laterality Date  . CYSTOSCOPY  1996   with stone extraction  . [place]  . I&D EXTREMITY Left 05/09/2016   Procedure: IRRIGATION AND DEBRIDEMENT LEFT INDEX FINGER;  Surgeon: Leandrew Koyanagi, MD;  Location: [facility];  Service: Orthopedics;  Laterality: Left;  . TONSILLECTOMY         Home Medications    Prior to Admission medications   Medication Sig Start Date End Date Taking? Authorizing Provider  doxycycline (VIBRAMYCIN) 100 MG  capsule Take 1 capsule (100 mg total) by mouth 2 (two) times daily. 04/12/16   Wardell Honour, MD  HYDROcodone-acetaminophen (NORCO) 10-325 MG per tablet Take 1 tablet by mouth every 6 (six) hours as needed. Reported on 04/04/2016    Historical Provider, MD  HYDROcodone-acetaminophen (NORCO) 5-325 MG tablet Take 1 tablet by mouth every 6 (six) hours as needed. 05/09/16   Naiping Ephriam Jenkins, MD  sulfamethoxazole-trimethoprim (BACTRIM DS) 800-160 MG tablet Take 1 tablet by mouth 2 (two) times daily. 05/09/16   Naiping Ephriam Jenkins, MD  sulfamethoxazole-trimethoprim (BACTRIM DS,SEPTRA DS) 800-160 MG tablet Take 1 tablet by mouth 2 (two) times daily. 04/04/16   Chelle Jeffery, PA-C  Tamsulosin HCl (FLOMAX) 0.4 MG CAPS Take 0.4 mg by mouth daily. Reported on 04/04/2016    Historical Provider, MD  triamcinolone ointment (KENALOG) 0.5 % Apply 1 application topically 2 (two) times daily. 04/12/16   Wardell Honour, MD    Family History No family history on file.  Social History Social History  Substance Use Topics  . Smoking status: Former Smoker    Quit date: 12/23/2009  . Smokeless tobacco: Current User    Types: Chew  . Alcohol use No     Allergies   Ibuprofen   Review of Systems Review of Systems  Genitourinary: Positive for frequency, hematuria and testicular pain.  All other systems reviewed and are negative.    Physical  Exam Updated Vital Signs BP 123/76 (BP Location: Right Arm)   Pulse 63   Temp 98 F (36.7 C) (Oral)   Resp 16   Ht 5\' 9"  (1.753 m)   Wt 160 lb (72.6 kg)   SpO2 96%   BMI 23.63 kg/m   Physical Exam  Constitutional: He is oriented to person, place, and time. He appears well-developed and well-nourished. No distress.  HENT:  Head: Normocephalic and atraumatic.  Mouth/Throat: Oropharynx is clear and moist.  Eyes: Conjunctivae and EOM are normal. Pupils are equal, round, and reactive to light.  Neck: Normal range of motion. Neck supple.  Cardiovascular: Normal rate, regular  rhythm and intact distal pulses.   No murmur heard. Pulmonary/Chest: Effort normal and breath sounds normal. No respiratory distress. He has no wheezes. He has no rales.  Abdominal: Soft. He exhibits no distension. There is CVA tenderness. There is no rebound and no guarding. No hernia.  Left flank tenderness. No reproducible abdominal pain  Musculoskeletal: Normal range of motion. He exhibits no edema or tenderness.  Neurological: He is alert and oriented to person, place, and time.  Skin: Skin is warm and dry. No rash noted. No erythema.  Psychiatric: He has a normal mood and affect. His behavior is normal.  Nursing note and vitals reviewed.    ED Treatments / Results  Labs (all labs ordered are listed, but only abnormal results are displayed) Labs Reviewed  CBC WITH DIFFERENTIAL/PLATELET - Abnormal; Notable for the following:       Result Value   MCHC 36.1 (*)    All other components within normal limits  URINALYSIS, ROUTINE W REFLEX MICROSCOPIC (NOT AT [facility]) - Abnormal; Notable for the following:    Color, Urine RED (*)    APPearance TURBID (*)    Hgb urine dipstick LARGE (*)    Bilirubin Urine MODERATE (*)    Ketones, ur 15 (*)    Protein, ur 100 (*)    Leukocytes, UA SMALL (*)    All other components within normal limits  URINE MICROSCOPIC-ADD ON - Abnormal; Notable for the following:    Squamous Epithelial / LPF 0-5 (*)    Bacteria, UA RARE (*)    Crystals CA OXALATE CRYSTALS (*)    All other components within normal limits  BASIC METABOLIC PANEL    EKG  EKG Interpretation None       Radiology Ct Renal Stone Study  Result Date: 08/26/2016 CLINICAL DATA:  Hematuria, acute left flank pain EXAM: CT ABDOMEN AND PELVIS WITHOUT CONTRAST TECHNIQUE: Multidetector CT imaging of the abdomen and pelvis was performed following the standard protocol without IV contrast. COMPARISON:  05/07/2016 FINDINGS: Lower chest: No acute abnormality. Hepatobiliary: No focal liver  abnormality is seen. No gallstones, gallbladder wall thickening, or biliary dilatation. Pancreas: Unremarkable. No pancreatic ductal dilatation or surrounding inflammatory changes. Spleen: Normal in size without focal abnormality. Adrenals/Urinary Tract: Normal adrenal glands. Right kidney and ureter demonstrate no acute obstructing process, nephrolithiasis, or ureteral calculus. Left kidney demonstrates mild hydronephrosis with associated proximal hydroureter. Left Periureteral strandy edema noted. This is secondary to a mildly obstructing proximal left ureteral 6 mm calculus, image 35. Adjacent stone debris suspected along the obstructing left ureteral calculus. Tiny additional punctate nephrolithiasis within the left kidney lower pole. Bladder is collapsed. Within the bladder dependently on the left, there is a bladder calculus measuring 15 x 8 mm, image 72. Stomach/Bowel: Stomach is within normal limits. Appendix appears partially fatty replaced without acute process. No  evidence of bowel wall thickening, distention, or inflammatory changes. Vascular/Lymphatic: Aortoiliac atherosclerosis noted without aneurysm. No adenopathy. Reproductive: Prostate is unremarkable. Other: No abdominal wall hernia or abnormality. No abdominopelvic ascites. Musculoskeletal: No acute or significant osseous findings. IMPRESSION: Mildly obstructing proximal left ureteral 6 mm calculus with mild left hydroureteronephrosis. 15 x 8 mm bladder calculus Aortoiliac atherosclerosis Electronically Signed   By: Jerilynn Mages.  Shick M.D.   On: 08/26/2016 14:50    Procedures Procedures (including critical care time)  Medications Ordered in ED Medications - No data to display   Initial Impression / Assessment and Plan / ED Course  I have reviewed the triage vital signs and the nursing notes.  Pertinent labs & imaging results that were available during my care of the patient were reviewed by me and considered in my medical decision making (see  chart for details).  Clinical Course     Patient is a 48 are old male with significant kidney stone history but no prior history of prostate issues presenting today with worsening hematuria and now left flank and testicle pain. He was seen by urology with normal ultrasound done 2 weeks ago. He states the pain today is not as bad as his typical kidney stone.  He denies any infectious symptoms but is concerned that maybe he has a bladder stone causing obstruction because he feels that he cannot completely empty his bladder. He denies passing any blood clots. The last time he was able to fully empty his bladder was small morning when he woke up. Low suspicion for infection at this time his UA is consistent with blood only without white blood cells.  Concern for bladder versus renal stones. Lower suspicion for obstruction. CT pending  3:12 PM Patient found to have a 6 mm left renal stone that is now in the ureter causing proximal hydro nephrosis. Also bladder stone present which may be causing the hematuria along with the renal stone. Patient will start back on the Flomax he already has. He was given pain control and encouraged to follow-up with urology.  Final Clinical Impressions(s) / ED Diagnoses   Final diagnoses:  Kidney stone on left side  Bladder stone    New Prescriptions New Prescriptions   HYDROCODONE-ACETAMINOPHEN (NORCO/VICODIN) 5-325 MG TABLET    Take 2 tablets by mouth every 6 (six) hours as needed.     Blanchie Dessert, MD 08/26/16 1515

## 2016-08-26 NOTE — ED Notes (Signed)
PT reports left flank pain, left testicle pain, frequency, and hematuria for two weeks. PT saw [facility] 08/14/16. PT had an [place] and Xray and was told he did not have any stones in his kidneys, but he may have a stone in his bladder. PT was told to follow up and was given an appt 12/15. PT reports he cannot wait that long to see another doctor. PT rates pain 5/10

## 2016-08-26 NOTE — ED Notes (Signed)
Care Handoff to Lyanne Co RN

## 2016-08-26 NOTE — ED Notes (Signed)
PT denies need for pain medicine at this time. PT rates pain 5/10. PT eating doritos and drinking water.

## 2016-08-30 ENCOUNTER — Other Ambulatory Visit: Payer: Self-pay | Admitting: Urology

## 2016-09-05 ENCOUNTER — Encounter (HOSPITAL_BASED_OUTPATIENT_CLINIC_OR_DEPARTMENT_OTHER): Payer: Self-pay | Admitting: *Deleted

## 2016-09-05 NOTE — Progress Notes (Signed)
NPO AFTER MN.  ARRIVE AT 1015.  CURRENT LAB RESULTS IN CHART AND EPIC. MAY TAKE PAIN RX AM DOS W/ SIPS OF WATER.

## 2016-09-07 ENCOUNTER — Ambulatory Visit (HOSPITAL_BASED_OUTPATIENT_CLINIC_OR_DEPARTMENT_OTHER): Payer: 59 | Admitting: Anesthesiology

## 2016-09-07 ENCOUNTER — Encounter (HOSPITAL_BASED_OUTPATIENT_CLINIC_OR_DEPARTMENT_OTHER)
Admission: RE | Disposition: A | Discharge status: Home / Self Care | Payer: Self-pay | Source: Ambulatory Visit | Attending: Urology

## 2016-09-07 ENCOUNTER — Encounter (HOSPITAL_BASED_OUTPATIENT_CLINIC_OR_DEPARTMENT_OTHER): Payer: Self-pay

## 2016-09-07 ENCOUNTER — Ambulatory Visit (HOSPITAL_BASED_OUTPATIENT_CLINIC_OR_DEPARTMENT_OTHER)
Admission: RE | Admit: 2016-09-07 | Discharge: 2016-09-07 | Disposition: A | Discharge status: Home / Self Care | Payer: 59 | Source: Ambulatory Visit | Attending: Urology | Admitting: Urology

## 2016-09-07 DIAGNOSIS — Z87891 Personal history of nicotine dependence: Secondary | ICD-10-CM | POA: Diagnosis not present

## 2016-09-07 DIAGNOSIS — N210 Calculus in bladder: Secondary | ICD-10-CM | POA: Diagnosis not present

## 2016-09-07 DIAGNOSIS — Z87442 Personal history of urinary calculi: Secondary | ICD-10-CM | POA: Diagnosis not present

## 2016-09-07 HISTORY — PX: CYSTOSCOPY WITH LITHOLAPAXY: SHX1425

## 2016-09-07 HISTORY — DX: Frequency of micturition: R35.0

## 2016-09-07 HISTORY — PX: HOLMIUM LASER APPLICATION: SHX5852

## 2016-09-07 HISTORY — DX: Personal history of diseases of the skin and subcutaneous tissue: Z87.2

## 2016-09-07 HISTORY — DX: Personal history of urinary calculi: Z87.442

## 2016-09-07 HISTORY — DX: Calculus in bladder: N21.0

## 2016-09-07 HISTORY — DX: Hematuria, unspecified: R31.9

## 2016-09-07 HISTORY — DX: Presence of spectacles and contact lenses: Z97.3

## 2016-09-07 HISTORY — DX: Calculus of kidney: N20.0

## 2016-09-07 HISTORY — DX: Urgency of urination: R39.15

## 2016-09-07 LAB — POCT HEMOGLOBIN-HEMACUE: HEMOGLOBIN: 16 g/dL (ref 13.0–17.0)

## 2016-09-07 SURGERY — CYSTOSCOPY, WITH BLADDER CALCULUS LITHOLAPAXY
Anesthesia: General | Site: Bladder

## 2016-09-07 MED ORDER — MIDAZOLAM HCL 2 MG/2ML IJ SOLN
INTRAMUSCULAR | Status: AC
  Filled 2016-09-07: qty 2

## 2016-09-07 MED ORDER — PROPOFOL 10 MG/ML IV BOLUS
INTRAVENOUS | Status: AC
  Filled 2016-09-07: qty 20

## 2016-09-07 MED ORDER — FENTANYL CITRATE (PF) 100 MCG/2ML IJ SOLN
25.0000 ug | INTRAMUSCULAR | Status: DC | PRN
  Administered 2016-09-07: 25 ug via INTRAVENOUS
  Filled 2016-09-07: qty 1

## 2016-09-07 MED ORDER — BELLADONNA ALKALOIDS-OPIUM 16.2-60 MG RE SUPP
RECTAL | Status: AC
  Filled 2016-09-07: qty 1

## 2016-09-07 MED ORDER — SULFAMETHOXAZOLE-TRIMETHOPRIM 800-160 MG PO TABS: 1.0000 | ORAL_TABLET | Freq: Two times a day (BID) | ORAL | 0 refills | Status: DC

## 2016-09-07 MED ORDER — LIDOCAINE 2% (20 MG/ML) 5 ML SYRINGE
INTRAMUSCULAR | Status: AC
  Filled 2016-09-07: qty 5

## 2016-09-07 MED ORDER — LACTATED RINGERS IV SOLN
INTRAVENOUS | Status: DC
  Administered 2016-09-07 (×2): via INTRAVENOUS
  Filled 2016-09-07: qty 1000

## 2016-09-07 MED ORDER — SODIUM CHLORIDE 0.9 % IR SOLN
Status: DC | PRN
  Administered 2016-09-07 (×2): 3000 mL via INTRAVESICAL

## 2016-09-07 MED ORDER — OXYCODONE HCL 5 MG PO TABS
5.0000 mg | ORAL_TABLET | Freq: Once | ORAL | Status: DC | PRN
  Filled 2016-09-07: qty 1

## 2016-09-07 MED ORDER — LIDOCAINE HCL (CARDIAC) 20 MG/ML IV SOLN
INTRAVENOUS | Status: DC | PRN
  Administered 2016-09-07: 80 mg via INTRAVENOUS

## 2016-09-07 MED ORDER — ONDANSETRON HCL 4 MG/2ML IJ SOLN
INTRAMUSCULAR | Status: AC
  Filled 2016-09-07: qty 2

## 2016-09-07 MED ORDER — DEXAMETHASONE SODIUM PHOSPHATE 4 MG/ML IJ SOLN
INTRAMUSCULAR | Status: DC | PRN
  Administered 2016-09-07: 10 mg via INTRAVENOUS

## 2016-09-07 MED ORDER — FENTANYL CITRATE (PF) 100 MCG/2ML IJ SOLN
INTRAMUSCULAR | Status: DC | PRN
  Administered 2016-09-07 (×2): 50 ug via INTRAVENOUS

## 2016-09-07 MED ORDER — OXYCODONE HCL 5 MG/5ML PO SOLN
5.0000 mg | Freq: Once | ORAL | Status: DC | PRN
  Filled 2016-09-07: qty 5

## 2016-09-07 MED ORDER — CEFAZOLIN IN D5W 1 GM/50ML IV SOLN
1.0000 g | INTRAVENOUS | Status: DC
  Filled 2016-09-07: qty 50

## 2016-09-07 MED ORDER — DEXAMETHASONE SODIUM PHOSPHATE 10 MG/ML IJ SOLN
INTRAMUSCULAR | Status: AC
  Filled 2016-09-07: qty 1

## 2016-09-07 MED ORDER — ONDANSETRON HCL 4 MG/2ML IJ SOLN
INTRAMUSCULAR | Status: DC | PRN
  Administered 2016-09-07: 4 mg via INTRAVENOUS

## 2016-09-07 MED ORDER — MIDAZOLAM HCL 5 MG/5ML IJ SOLN
INTRAMUSCULAR | Status: DC | PRN
  Administered 2016-09-07: 2 mg via INTRAVENOUS

## 2016-09-07 MED ORDER — PROMETHAZINE HCL 25 MG/ML IJ SOLN
6.2500 mg | INTRAMUSCULAR | Status: DC | PRN
  Filled 2016-09-07: qty 1

## 2016-09-07 MED ORDER — FENTANYL CITRATE (PF) 100 MCG/2ML IJ SOLN
INTRAMUSCULAR | Status: AC
  Filled 2016-09-07: qty 2

## 2016-09-07 MED ORDER — BELLADONNA ALKALOIDS-OPIUM 16.2-60 MG RE SUPP
1.0000 | Freq: Once | RECTAL | Status: AC
  Administered 2016-09-07: 1 via RECTAL
  Filled 2016-09-07: qty 1

## 2016-09-07 MED ORDER — PROPOFOL 10 MG/ML IV BOLUS
INTRAVENOUS | Status: DC | PRN
  Administered 2016-09-07: 150 mg via INTRAVENOUS

## 2016-09-07 MED ORDER — CEFAZOLIN SODIUM-DEXTROSE 2-4 GM/100ML-% IV SOLN
INTRAVENOUS | Status: AC
  Filled 2016-09-07: qty 100

## 2016-09-07 MED ORDER — CEFAZOLIN SODIUM-DEXTROSE 2-4 GM/100ML-% IV SOLN
2.0000 g | INTRAVENOUS | Status: AC
  Administered 2016-09-07: 2 g via INTRAVENOUS
  Filled 2016-09-07: qty 100

## 2016-09-07 MED ORDER — KETOROLAC TROMETHAMINE 30 MG/ML IJ SOLN
30.0000 mg | Freq: Once | INTRAMUSCULAR | Status: DC | PRN
  Filled 2016-09-07: qty 1

## 2016-09-07 SURGICAL SUPPLY — 11 items
BAG DRAIN URO-CYSTO SKYTR STRL (DRAIN) ×2 IMPLANT
BAG DRN UROCATH (DRAIN) ×1
CARTRIDGE STONEBREAK CO2 KIDNE (ELECTROSURGICAL) ×1 IMPLANT
CLOTH BEACON ORANGE TIMEOUT ST (SAFETY) ×3 IMPLANT
FIBER LASER FLEXIVA 1000 (UROLOGICAL SUPPLIES) ×1 IMPLANT
GLOVE BIO SURGEON STRL SZ8 (GLOVE) ×2 IMPLANT
KIT ROOM TURNOVER WOR (KITS) ×2 IMPLANT
MANIFOLD NEPTUNE II (INSTRUMENTS) ×2 IMPLANT
PACK CYSTO (CUSTOM PROCEDURE TRAY) ×2 IMPLANT
PROBE PNEUMATIC 1.6MM (ELECTROSURGICAL) ×1 IMPLANT
WATER STERILE IRR 500ML POUR (IV SOLUTION) ×1 IMPLANT

## 2016-09-07 NOTE — Anesthesia Preprocedure Evaluation (Addendum)
Anesthesia Evaluation  Patient identified by MRN, date of birth, ID band Patient awake    Reviewed: Allergy & Precautions, NPO status , Patient's Chart, lab work & pertinent test results  Airway Mallampati: III  TM Distance: <3 FB Neck ROM: Full    Dental no notable dental hx.    Pulmonary neg pulmonary ROS, former smoker,    Pulmonary exam normal breath sounds clear to auscultation       Cardiovascular negative cardio ROS Normal cardiovascular exam Rhythm:Regular Rate:Normal     Neuro/Psych negative neurological ROS  negative psych ROS   GI/Hepatic negative GI ROS, Neg liver ROS,   Endo/Other  negative endocrine ROS  Renal/GU negative Renal ROS  negative genitourinary   Musculoskeletal negative musculoskeletal ROS (+)   Abdominal   Peds negative pediatric ROS (+)  Hematology negative hematology ROS (+)   Anesthesia Other Findings   Reproductive/Obstetrics negative OB ROS                            Anesthesia Physical Anesthesia Plan  ASA: II  Anesthesia Plan: General   Post-op Pain Management:    Induction: Intravenous  Airway Management Planned: LMA  Additional Equipment:   Intra-op Plan:   Post-operative Plan: Extubation in OR  Informed Consent: I have reviewed the patients History and Physical, chart, labs and discussed the procedure including the risks, benefits and alternatives for the proposed anesthesia with the patient or authorized representative who has indicated his/her understanding and acceptance.   Dental advisory given  Plan Discussed with: CRNA and Surgeon  Anesthesia Plan Comments:         Anesthesia Quick Evaluation

## 2016-09-07 NOTE — Transfer of Care (Signed)
  Last Vitals:  Vitals:   09/07/16 1022  BP: 136/70  Pulse: (!) 50  Resp: 16  Temp: 36.6 C    Last Pain:  Vitals:   09/07/16 1022  TempSrc: Oral      Patients Stated Pain Goal: 8 (09/07/16 1033)  Immediate Anesthesia Transfer of Care Note  Patient: Matthew Bradford  Procedure(s) Performed: Procedure(s) (LRB): CYSTOSCOPY WITH LITHOLAPAXY (N/A) HOLMIUM LASER APPLICATION (N/A)  Patient Location: PACU  Anesthesia Type: General  Level of Consciousness: awake, alert  and oriented  Airway & Oxygen Therapy: Patient Spontanous Breathing and Patient connected to nasal cannula oxygen  Post-op Assessment: Report given to PACU RN and Post -op Vital signs reviewed and stable  Post vital signs: Reviewed and stable  Complications: No apparent anesthesia complications

## 2016-09-07 NOTE — Anesthesia Postprocedure Evaluation (Signed)
Anesthesia Post Note  Patient: Matthew Bradford  Procedure(s) Performed: Procedure(s) (LRB): CYSTOSCOPY WITH LITHOLAPAXY (N/A) HOLMIUM LASER APPLICATION (N/A)  Patient location during evaluation: PACU Anesthesia Type: General Level of consciousness: awake and alert Pain management: pain level controlled Vital Signs Assessment: post-procedure vital signs reviewed and stable Respiratory status: spontaneous breathing, nonlabored ventilation, respiratory function stable and patient connected to nasal cannula oxygen Cardiovascular status: blood pressure returned to baseline and stable Postop Assessment: no signs of nausea or vomiting Anesthetic complications: no    Last Vitals:  Vitals:   09/07/16 1022 09/07/16 1241  BP: 136/70   Pulse: (!) 50   Resp: 16   Temp: 36.6 C 36.4 C    Last Pain:  Vitals:   09/07/16 1022  TempSrc: Oral                 Aoife Bold S

## 2016-09-07 NOTE — Anesthesia Procedure Notes (Signed)
Procedure Name: LMA Insertion Date/Time: 09/07/2016 12:08 PM Performed by: Myrtie Soman Pre-anesthesia Checklist: Patient identified, Emergency Drugs available, Suction available and Patient being monitored Patient Re-evaluated:Patient Re-evaluated prior to inductionOxygen Delivery Method: Circle system utilized Preoxygenation: Pre-oxygenation with 100% oxygen Intubation Type: IV induction Ventilation: Mask ventilation without difficulty LMA: LMA inserted LMA Size: 4.0 Number of attempts: 1 Airway Equipment and Method: Bite block Placement Confirmation: positive ETCO2 Tube secured with: Tape Dental Injury: Teeth and Oropharynx as per pre-operative assessment

## 2016-09-07 NOTE — H&P (Signed)
Urology History and Physical Exam  CC: bladder stone  HPI: 55 year old male with a history of urolithiasis.  He has passed kidney stones before.  The patient recently passed a 6 mm ureteral stone.  He also had a stone earlier in the year.  Apparently, this never passed, and upon follow-up, the patient had a 15 mm bladder stone.  He presents at this time for cystolitholapaxy.  PMH: Past Medical History:  Diagnosis Date  . Bladder stone   . Frequency of urination   . Hematuria   . History of cellulitis    03-2016  finger  . History of kidney stones   . Nephrolithiasis   . Urgency of urination   . Wears glasses     PSH: Past Surgical History:  Procedure Laterality Date  . CYSTOSCOPY/RETROGRADE/URETEROSCOPY/STONE EXTRACTION WITH BASKET  1996  . EXTRACORPOREAL SHOCK WAVE LITHOTRIPSY  11/29/2014  . I&D EXTREMITY Left 05/09/2016   Procedure: IRRIGATION AND DEBRIDEMENT LEFT INDEX FINGER;  Surgeon: Leandrew Koyanagi, MD;  Location: [facility];  Service: Orthopedics;  Laterality: Left;  . TONSILLECTOMY  1979    Allergies: Allergies  Allergen Reactions  . Ibuprofen Other (See Comments)    "feels like gas build up in abdomin"    Medications: No prescriptions prior to admission.     Social History: Social History   Social History  . Marital status: Married    Spouse name: N/A  . Number of children: N/A  . Years of education: N/A   Occupational History  . Not on file.   Social History Main Topics  . Smoking status: Former Smoker    Years: 30.00    Types: Cigarettes    Quit date: 12/23/2009  . Smokeless tobacco: Current User    Types: Chew     Comment: occasional chew tobacco  . Alcohol use No  . Drug use: No  . Sexual activity: Not on file   Other Topics Concern  . Not on file   Social History Narrative  . No narrative on file    Family History: History reviewed. No pertinent family history.  Review of Systems: Positive: recent hematuria, flank  pain-resolved Negative:   A further 10 point review of systems was negative except what is listed in the HPI.                  Physical Exam: @VITALS2 @ General: No acute distress.  Awake. Head:  Normocephalic.  Atraumatic. ENT:  EOMI.  Mucous membranes moist Neck:  Supple.  No lymphadenopathy. CV:  S1 present. S2 present. Regular rate. Pulmonary: Equal effort bilaterally.  Clear to auscultation bilaterally. Abdomen: Soft.  none tender to palpation. Skin:  Normal turgor.  No visible rash. Extremity: No gross deformity of bilateral upper extremities.  No gross deformity of                             lower extremities. Neurologic: Alert. Appropriate mood.    Studies:  No results for input(s): HGB, WBC, PLT in the last 72 hours.  No results for input(s): NA, K, CL, CO2, BUN, CREATININE, CALCIUM, GFRNONAA, GFRAA in the last 72 hours.  Invalid input(s): MAGNESIUM   No results for input(s): INR, APTT in the last 72 hours.  Invalid input(s): PT   Invalid input(s): ABG    Assessment:  15 mm bladder stone  Plan: cystolitholapaxy

## 2016-09-07 NOTE — Op Note (Signed)
Preoperative diagnosis: 15 millimeter bladder calculus.  Postoperative diagnosis: Same  Principal procedure: Cystolitholapaxy.  A 15 millimeter bladder calculus with holmium laser.  Surgeon: Diona Fanti   Anesthesia: Gen. With LMA  Complications: None.  Drains: 1 French Foley catheter to leg bag.  Specimen: Stone fragments, given to the patient's wife  Indications: 55 year old male with history of urolithiasis.  He recently passed a 6 millimeter ureteral stone, but on evaluation, was found to have a 15 millimeter bladder calculus.  The patient does have significant lower urinary tract symptoms with this.  He desires surgical management.  We have discussed cystolitholapaxy.  Risks and complications of the procedure have been discussed.  He understands and desires to proceed.  Description of procedure: The patient was properly identified in the holding area.  He received preoperative IV antibiotics.  He was taken to the operating room and general anesthesia was administered with the LMA.  He was placed in the dorsolithotomy position.  Genitalia and perineum were prepped and draped.  Proper timeout was performed.  A 21 French panendoscope was advanced through the urethra which was normal.  Prostate was minimally obstructive.  The bladder was then entered and inspected circumferentially.  There were no tumors or trabeculations.  Ureteral orifices were normal in configuration and location.  A brownish stone, ovoid in shape, was located in the trigonal area.  It was approximately 15 millimeters in size.  It was fragmented with the holmium laser, using a 1000 micron fiber, settings 1.8 joules and a frequency of 15 hertz.  The stone was fragmented into multiple smaller fragments.  There were then easily irrigated with the [place] syringe.  All powder/sand-like fragments were irrigated out as well.  Inspection of the bladder.  After this revealed no further stone material.  I then removed the scope.  The  bladder was then drained with 18 [place] Foley catheter which was left indwelling and hooked to a leg bag.  The patient tolerated procedure well.  He was then awakened, taken to the PACU in stable condition.

## 2016-09-07 NOTE — Discharge Instructions (Signed)

## 2016-09-10 ENCOUNTER — Encounter (HOSPITAL_BASED_OUTPATIENT_CLINIC_OR_DEPARTMENT_OTHER): Payer: Self-pay | Admitting: Urology

## 2016-10-24 DIAGNOSIS — N200 Calculus of kidney: Secondary | ICD-10-CM | POA: Diagnosis not present

## 2016-10-24 DIAGNOSIS — N210 Calculus in bladder: Secondary | ICD-10-CM | POA: Diagnosis not present

## 2016-12-17 ENCOUNTER — Emergency Department (HOSPITAL_COMMUNITY): Payer: 59

## 2016-12-17 ENCOUNTER — Encounter (HOSPITAL_COMMUNITY): Payer: Self-pay | Admitting: Emergency Medicine

## 2016-12-17 ENCOUNTER — Observation Stay (HOSPITAL_COMMUNITY)
Admission: EM | Admit: 2016-12-17 | Discharge: 2016-12-18 | Disposition: A | Discharge status: Home / Self Care | Payer: 59 | Attending: Family Medicine | Admitting: Family Medicine

## 2016-12-17 DIAGNOSIS — F17220 Nicotine dependence, chewing tobacco, uncomplicated: Secondary | ICD-10-CM | POA: Insufficient documentation

## 2016-12-17 DIAGNOSIS — R0602 Shortness of breath: Secondary | ICD-10-CM | POA: Diagnosis not present

## 2016-12-17 DIAGNOSIS — Z79899 Other long term (current) drug therapy: Secondary | ICD-10-CM | POA: Insufficient documentation

## 2016-12-17 DIAGNOSIS — D649 Anemia, unspecified: Principal | ICD-10-CM | POA: Diagnosis present

## 2016-12-17 DIAGNOSIS — Z87442 Personal history of urinary calculi: Secondary | ICD-10-CM | POA: Diagnosis present

## 2016-12-17 LAB — COMPREHENSIVE METABOLIC PANEL
ALT: 27 U/L (ref 17–63)
ANION GAP: 10 (ref 5–15)
AST: 29 U/L (ref 15–41)
Albumin: 4.2 g/dL (ref 3.5–5.0)
Alkaline Phosphatase: 69 U/L (ref 38–126)
BUN: 13 mg/dL (ref 6–20)
CHLORIDE: 107 mmol/L (ref 101–111)
CO2: 23 mmol/L (ref 22–32)
Calcium: 8.9 mg/dL (ref 8.9–10.3)
Creatinine, Ser: 1.17 mg/dL (ref 0.61–1.24)
GFR calc Af Amer: >60 mL/min (ref >60)
GFR calc non Af Amer: >60 mL/min (ref >60)
GLUCOSE: 129 mg/dL — AB (ref 65–99)
POTASSIUM: 3.7 mmol/L (ref 3.5–5.1)
Sodium: 140 mmol/L (ref 135–145)
TOTAL PROTEIN: 7.1 g/dL (ref 6.5–8.1)
Total Bilirubin: 1.2 mg/dL (ref 0.3–1.2)

## 2016-12-17 LAB — RETICULOCYTES
RBC.: 4.61 MIL/uL (ref 4.22–5.81)
Retic Count, Absolute: 59.9 10*3/uL (ref 19.0–186.0)
Retic Ct Pct: 1.3 % (ref 0.4–3.1)

## 2016-12-17 LAB — POC OCCULT BLOOD, ED: Fecal Occult Bld: NEGATIVE

## 2016-12-17 LAB — PREPARE RBC (CROSSMATCH)

## 2016-12-17 LAB — TROPONIN I

## 2016-12-17 LAB — ABO/RH: ABO/RH(D): O NEG

## 2016-12-17 MED ORDER — KETOROLAC TROMETHAMINE 30 MG/ML IJ SOLN: 30.0000 mg | Freq: Once | INTRAMUSCULAR | Status: DC

## 2016-12-17 MED ORDER — SODIUM CHLORIDE 0.9% FLUSH
3.0000 mL | Freq: Two times a day (BID) | INTRAVENOUS | Status: DC
  Administered 2016-12-17: 3 mL via INTRAVENOUS

## 2016-12-17 MED ORDER — SODIUM CHLORIDE 0.9 % IV SOLN: 250.0000 mL | INTRAVENOUS | Status: DC | PRN

## 2016-12-17 MED ORDER — SODIUM CHLORIDE 0.9% FLUSH: 3.0000 mL | INTRAVENOUS | Status: DC | PRN

## 2016-12-17 MED ORDER — SODIUM CHLORIDE 0.9 % IV SOLN: 10.0000 mL/h | Freq: Once | INTRAVENOUS | Status: DC

## 2016-12-17 NOTE — ED Triage Notes (Signed)
PT c/o headaches over the past two weeks but denies any today. PT also c/o SOB at times over the past 10 days.

## 2016-12-17 NOTE — H&P (Signed)
History and Physical    Matthew Bradford PPI:[id number] DOB: 01-Aug-1961 DOA: 12/17/2016  PCP: Purvis Kilts, MD  Patient coming from: home  Chief Complaint:   Sob, weak, headaches  HPI: Matthew Bradford is a 56 y.o. male with medical history significant of kidney stones comes in with 2 weeks of sob and feeling weak.  He denies any fever or cough.  Denies any syncope.  No brbpr or melana.  No vomiting.  No abdominal pain or chest pain.  No swelling in his legs.  No hematuria.  No h/o anemia in the past.  Has not had any screening colonoscopy.  Pt is very upset because he is here.  States he called dr Cornelia Copa office today who would not see him as he had not been seen there in over 5 years.  Pt states "I wanted to slap the secretary over there".  After trying to explain to pt that it is routine practice to be considered a new patient if you have not seen your PCP in 2 years, he continues to complain about having to go to the ER.  Pt is referred for admission for symptomatic anemia.   Review of Systems: As per HPI otherwise 10 point review of systems negative.   Past Medical History:  Diagnosis Date  . Bladder stone   . Frequency of urination   . Hematuria   . History of cellulitis    03-2016  finger  . History of kidney stones   . Nephrolithiasis   . Urgency of urination   . Wears glasses     Past Surgical History:  Procedure Laterality Date  . CYSTOSCOPY WITH LITHOLAPAXY N/A 09/07/2016   Procedure: CYSTOSCOPY WITH LITHOLAPAXY;  Surgeon: Franchot Gallo, MD;  Location: [facility];  Service: Urology;  Laterality: N/A;  . CYSTOSCOPY/RETROGRADE/URETEROSCOPY/STONE EXTRACTION WITH BASKET  1996  . EXTRACORPOREAL SHOCK WAVE LITHOTRIPSY  11/29/2014  . HOLMIUM LASER APPLICATION N/A 03/23/1600   Procedure: HOLMIUM LASER APPLICATION;  Surgeon: Franchot Gallo, MD;  Location: [facility];  Service: Urology;  Laterality: N/A;  . I&D EXTREMITY Left 05/09/2016     Procedure: IRRIGATION AND DEBRIDEMENT LEFT INDEX FINGER;  Surgeon: Leandrew Koyanagi, MD;  Location: [place];  Service: Orthopedics;  Laterality: Left;  . TONSILLECTOMY  1979     reports that he quit smoking about 6 years ago. His smoking use included Cigarettes. He quit after 30.00 years of use. His smokeless tobacco use includes Chew. He reports that he does not drink alcohol or use drugs.  Allergies  Allergen Reactions  . Ibuprofen Other (See Comments)    "feels like gas build up in abdomin"    History reviewed. No pertinent family history.  Prior to Admission medications   Medication Sig Start Date End Date Taking? Authorizing Provider  acetaminophen (TYLENOL) 500 MG tablet Take 1,000 mg by mouth every 4 (four) hours as needed for headache.   Yes Historical Provider, MD    Physical Exam: Vitals:   12/17/16 1548 12/17/16 1549 12/17/16 1815  BP: 134/83  137/80  Pulse: 65  (!) 56  Resp: 20  20  Temp: 98.1 F (36.7 C)  97.7 F (36.5 C)  TempSrc: Oral  Oral  SpO2: 95%  98%  Weight:  81.6 kg (180 lb)   Height:  5\' 9"  (1.753 m)     Constitutional: NAD, calm, comfortable Vitals:   12/17/16 1548 12/17/16 1549 12/17/16 1815  BP: 134/83  137/80  Pulse: 65  (!) 56  Resp: 20  20  Temp: 98.1 F (36.7 C)  97.7 F (36.5 C)  TempSrc: Oral  Oral  SpO2: 95%  98%  Weight:  81.6 kg (180 lb)   Height:  5\' 9"  (1.753 m)    Eyes: PERRL, lids and conjunctivae normal ENMT: Mucous membranes are moist. Posterior pharynx clear of any exudate or lesions.Normal dentition.  Neck: normal, supple, no masses, no thyromegaly Respiratory: clear to auscultation bilaterally, no wheezing, no crackles. Normal respiratory effort. No accessory muscle use.  Cardiovascular: Regular rate and rhythm, no murmurs / rubs / gallops. No extremity edema. 2+ pedal pulses. No carotid bruits.  Abdomen: no tenderness, no masses palpated. No hepatosplenomegaly. Bowel sounds positive.  Musculoskeletal:  no clubbing / cyanosis. No joint deformity upper and lower extremities. Good ROM, no contractures. Normal muscle tone.  Skin: no rashes, lesions, ulcers. No induration Neurologic: CN 2-12 grossly intact. Sensation intact, DTR normal. Strength 5/5 in all 4.  Psychiatric: Normal judgment and insight. Alert and oriented x 3. Normal mood.    Labs on Admission: I have personally reviewed following labs and imaging studies  CBC:  Recent Labs Lab 12/17/16 1613  WBC 5.7  NEUTROABS 3.5  HGB 7.0*  HCT 42.7  MCV 91.8  PLT 128   Basic Metabolic Panel:  Recent Labs Lab 12/17/16 1613  NA 140  K 3.7  CL 107  CO2 23  GLUCOSE 129*  BUN 13  CREATININE 1.17  CALCIUM 8.9   GFR: Estimated Creatinine Clearance: 70.5 mL/min (by C-G formula based on SCr of 1.17 mg/dL). Liver Function Tests:  Recent Labs Lab 12/17/16 1613  AST 29  ALT 27  ALKPHOS 69  BILITOT 1.2  PROT 7.1  ALBUMIN 4.2   Cardiac Enzymes:  Recent Labs Lab 12/17/16 1613  TROPONINI <0.03   Radiological Exams on Admission: Dg Chest 2 View  Result Date: 12/17/2016 CLINICAL DATA:  Shortness of breath, headache EXAM: CHEST  2 VIEW COMPARISON:  None. FINDINGS: Lungs are clear.  No pleural effusion or pneumothorax. The heart is normal in size. Visualized osseous structures are within normal limits. IMPRESSION: Normal chest radiographs. Electronically Signed   By: Julian Hy M.D.   On: 12/17/2016 16:06    EKG: Independently reviewed. Sinus brady rate 58 no acute issues   Assessment/Plan Principal Problem:   Symptomatic anemia- pt is agreeable to transfuse of 2 units of prbcs tonight.  Heme occult neg.  Anemia panel is pending.  Will need further work up which can be done as outpatient.  No overt bleeding evident.  Active Problems:   History of nephrolithiasis- noted   DVT prophylaxis:  scds  Code Status:   full Family Communication: none Disposition Plan:  Per day team Consults called:  none Admission  status:   observation   DAVID,RACHAL A MD [facility]  If 7PM-7AM, please contact night-coverage [email or web address] Password [facility]  12/17/2016, 8:43 PM

## 2016-12-17 NOTE — ED Notes (Signed)
Blood Bank called and said that blood is ready for transfusion.

## 2016-12-17 NOTE — ED Provider Notes (Signed)
[place] DEPT Provider Note   CSN: [id number] Arrival date & time: 12/17/16  1532     History   Chief Complaint Chief Complaint  Patient presents with  . Shortness of Breath  . Headache    HPI Matthew Bradford is a 56 y.o. male.  Shortness of breath with exertion and with lying down. Review systems positive for right parietal headache and sensation of heartburn from his epigastrium to the central chest with radiation to left arm. No previous history of anemia or upper GI/lower GI bleed. No previous endoscopy or colonoscopy. No history of heart problems.  He quit smoking several years ago. He works in a [employer]. Family history negative for early MI. He blames his headache on "sinuses" and he takes Claritin and Mucinex D for same.      Past Medical History:  Diagnosis Date  . Bladder stone   . Frequency of urination   . Hematuria   . History of cellulitis    03-2016  finger  . History of kidney stones   . Nephrolithiasis   . Urgency of urination   . Wears glasses     Patient Active Problem List   Diagnosis Date Noted  . History of nephrolithiasis 04/09/2016  . BMI 26.0-26.9,adult 03/31/2016  . Nephrolithiasis 03/31/2016    Past Surgical History:  Procedure Laterality Date  . CYSTOSCOPY WITH LITHOLAPAXY N/A 09/07/2016   Procedure: CYSTOSCOPY WITH LITHOLAPAXY;  Surgeon: Franchot Gallo, MD;  Location[facility];  Service: Urology;  Laterality: N/A;  . CYSTOSCOPY/RETROGRADE/URETEROSCOPY/STONE EXTRACTION WITH BASKET  1996  . EXTRACORPOREAL SHOCK WAVE LITHOTRIPSY  11/29/2014  . HOLMIUM LASER APPLICATION N/A [id number]/9628   Procedure: HOLMIUM LASER APPLICATION;  Surgeon: Franchot Gallo, MD;  Location: [facility];  Service: Urology;  Laterality: N/A;  . I&D EXTREMITY Left 05/09/2016   Procedure: IRRIGATION AND DEBRIDEMENT LEFT INDEX FINGER;  Surgeon: Leandrew Koyanagi, MD;  Location: [place];  Service: Orthopedics;   Laterality: Left;  . TONSILLECTOMY  1979       Home Medications    Prior to Admission medications   Medication Sig Start Date End Date Taking? Authorizing Provider  acetaminophen (TYLENOL) 500 MG tablet Take 1,000 mg by mouth every 4 (four) hours as needed for headache.   Yes Historical Provider, MD    Family History History reviewed. No pertinent family history.  Social History Social History  Substance Use Topics  . Smoking status: Former Smoker    Years: 30.00    Types: Cigarettes    Quit date: 12/23/2009  . Smokeless tobacco: Current User    Types: Chew     Comment: occasional chew tobacco  . Alcohol use No     Allergies   Ibuprofen   Review of Systems Review of Systems  All other systems reviewed and are negative.    Physical Exam Updated Vital Signs BP 137/80 (BP Location: Right Arm)   Pulse (!) 56   Temp 97.7 F (36.5 C) (Oral)   Resp 20   Ht 5\' 9"  (1.753 m)   Wt 180 lb (81.6 kg)   SpO2 98%   BMI 26.58 kg/m   Physical Exam  Constitutional: He is oriented to person, place, and time.  ? pale, pleasant  HENT:  Head: Normocephalic and atraumatic.  Eyes: Conjunctivae are normal.  Neck: Neck supple.  Cardiovascular: Normal rate and regular rhythm.   Pulmonary/Chest: Effort normal and breath sounds normal.  Abdominal: Soft. Bowel sounds are normal.  Genitourinary:  Genitourinary Comments: Rectal exam: No masses, heme negative.  Musculoskeletal: Normal range of motion.  Neurological: He is alert and oriented to person, place, and time.  Skin: Skin is warm and dry.  Psychiatric: He has a normal mood and affect. His behavior is normal.  Nursing note and vitals reviewed.    ED Treatments / Results  Labs (all labs ordered are listed, but only abnormal results are displayed) Labs Reviewed  CBC WITH DIFFERENTIAL/PLATELET - Abnormal; Notable for the following:       Result Value   Hemoglobin 7.0 (*)    MCH 39.5 (*)    All other components within  normal limits  COMPREHENSIVE METABOLIC PANEL - Abnormal; Notable for the following:    Glucose, Bld 129 (*)    All other components within normal limits  TROPONIN I  POC OCCULT BLOOD, ED  TYPE AND SCREEN    EKG  EKG Interpretation None       Radiology Dg Chest 2 View  Result Date: 12/17/2016 CLINICAL DATA:  Shortness of breath, headache EXAM: CHEST  2 VIEW COMPARISON:  None. FINDINGS: Lungs are clear.  No pleural effusion or pneumothorax. The heart is normal in size. Visualized osseous structures are within normal limits. IMPRESSION: Normal chest radiographs. Electronically Signed   By: Julian Hy M.D.   On: 12/17/2016 16:06    Procedures Procedures (including critical care time)  Medications Ordered in ED Medications - No data to display   Initial Impression / Assessment and Plan / ED Course  I have reviewed the triage vital signs and the nursing notes.  Pertinent labs & imaging results that were available during my care of the patient were reviewed by me and considered in my medical decision making (see chart for details).     Patient complains of dyspnea with exertion. Last hemoglobin on 09/07/16 was 16.0. Today it is 7.0. Rectal exam negative for heme. We'll admit for a transfusion for symptomatic anemia Final Clinical Impressions(s) / ED Diagnoses   Final diagnoses:  Anemia, unspecified type    New Prescriptions New Prescriptions   No medications on file     Nat Christen, MD 12/17/16 2022

## 2016-12-18 DIAGNOSIS — Z87442 Personal history of urinary calculi: Secondary | ICD-10-CM

## 2016-12-18 LAB — CBC WITH DIFFERENTIAL/PLATELET
BASOS ABS: 0 10*3/uL (ref 0.0–0.1)
Basophils Relative: 0 %
Eosinophils Absolute: 0.1 10*3/uL (ref 0.0–0.7)
Eosinophils Relative: 2 %
HCT: 42.7 % (ref 39.0–52.0)
Hemoglobin: 15.7 g/dL (ref 13.0–17.0)
LYMPHS PCT: 30 %
Lymphs Abs: 1.7 10*3/uL (ref 0.7–4.0)
MCH: 39.5 pg — AB (ref 26.0–34.0)
MCHC: 36.8 g/dL — ABNORMAL HIGH (ref 30.0–36.0)
MCV: 91.8 fL (ref 78.0–100.0)
Monocytes Absolute: 0.3 10*3/uL (ref 0.1–1.0)
Monocytes Relative: 6 %
Neutro Abs: 3.5 10*3/uL (ref 1.7–7.7)
Neutrophils Relative %: 62 %
PLATELETS: 182 10*3/uL (ref 150–400)
RBC: 4.65 MIL/uL (ref 4.22–5.81)
RDW: 12.4 % (ref 11.5–15.5)
WBC: 5.7 10*3/uL (ref 4.0–10.5)

## 2016-12-18 LAB — CBC
HCT: 47.7 % (ref 39.0–52.0)
Hemoglobin: 17.6 g/dL — ABNORMAL HIGH (ref 13.0–17.0)
MCH: 33.3 pg (ref 26.0–34.0)
MCHC: 36.9 g/dL — ABNORMAL HIGH (ref 30.0–36.0)
MCV: 90.3 fL (ref 78.0–100.0)
PLATELETS: 153 10*3/uL (ref 150–400)
RBC: 5.28 MIL/uL (ref 4.22–5.81)
RDW: 13.1 % (ref 11.5–15.5)
WBC: 5 10*3/uL (ref 4.0–10.5)

## 2016-12-18 LAB — VITAMIN B12: VITAMIN B 12: 214 pg/mL (ref 180–914)

## 2016-12-18 LAB — FERRITIN: FERRITIN: 114 ng/mL (ref 24–336)

## 2016-12-18 LAB — FOLATE: Folate: 16.2 ng/mL (ref >5.9)

## 2016-12-18 LAB — IRON AND TIBC
IRON: 108 ug/dL (ref 45–182)
Saturation Ratios: 29 % (ref 17.9–39.5)
TIBC: 367 ug/dL (ref 250–450)
UIBC: 259 ug/dL

## 2016-12-18 NOTE — Progress Notes (Signed)
Received call from blood bank. Patient's initial reported hemoglobin at time of admission was not 7 but 15.7, and after 2 units of PRBC's his hemoglobin is now 17.6. I paged Dr. Adair Patter, and notified the charge nurse regarding situation.

## 2016-12-19 LAB — TYPE AND SCREEN
ABO/RH(D): O NEG
Antibody Screen: NEGATIVE
UNIT DIVISION: 0
Unit division: 0

## 2016-12-19 LAB — BPAM RBC
BLOOD PRODUCT EXPIRATION DATE: 201804042359
BLOOD PRODUCT EXPIRATION DATE: 201804122359
ISSUE DATE / TIME: 201803270326
ISSUE DATE / TIME: 201803270022
UNIT TYPE AND RH: 9500
UNIT TYPE AND RH: 9500

## 2017-01-08 NOTE — Discharge Summary (Signed)
Physician Discharge Summary  Matthew Bradford NID:[id number] DOB: 1960/11/06 DOA: 12/17/2016  PCP: Purvis Kilts, MD  Admit date: 12/17/2016 Discharge date: 12/18/2016  Admitted From:Home  Disposition: Home  Recommendations for Outpatient Follow-up:  Get established with PCP Get repeat blood draw within 1 week   Home Health:No Equipment/Devices: No  Discharge Condition:Stable CODE STATUS:Full Code Diet recommendation: Regular  Brief/Interim Summary: Matthew Bradford is a 56 y.o. male with medical history significant of kidney stones comes in with 2 weeks of sob and feeling weak.  He denies any fever or cough.  Denies any syncope.  No brbpr or melana.  No vomiting.  No abdominal pain or chest pain.  No swelling in his legs.  No hematuria.  No h/o anemia in the past.  Has not had any screening colonoscopy.  Pt is very upset because he is here.  States he called dr Cornelia Copa office today who would not see him as he had not been seen there in over 5 years.  Pt states "I wanted to slap the secretary over there".  After trying to explain to pt that it is routine practice to be considered a new patient if you have not seen your PCP in 2 years, he continues to complain about having to go to the ER.  Pt is referred for admission for symptomatic anemia.  However, on morning after admission lab called nursing staff and reported lab value error of patient's initial labs.  Patient was informed and he demanded discharge.  He did not want to discuss follow up or further investigation.  Nursing supervisor was notified.   Discharge Diagnoses:  Principal Problem:   Anemia Active Problems:   History of nephrolithiasis    Discharge Instructions  Discharge Instructions    Call MD for:  difficulty breathing, headache or visual disturbances    Complete by:  As directed    Call MD for:  extreme fatigue    Complete by:  As directed    Call MD for:  hives    Complete by:  As directed    Call MD for:   persistant dizziness or light-headedness    Complete by:  As directed    Call MD for:  persistant nausea and vomiting    Complete by:  As directed    Call MD for:  severe uncontrolled pain    Complete by:  As directed    Call MD for:  temperature >100.4    Complete by:  As directed    Diet general    Complete by:  As directed    Increase activity slowly    Complete by:  As directed      Allergies as of 12/18/2016      Reactions   Ibuprofen Other (See Comments)   "feels like gas build up in abdomin"      Medication List    TAKE these medications   acetaminophen 500 MG tablet Commonly known as:  TYLENOL Take 1,000 mg by mouth every 4 (four) hours as needed for headache.       Allergies  Allergen Reactions  . Ibuprofen Other (See Comments)    "feels like gas build up in abdomin"    Consultations:  None   Procedures/Studies: Dg Chest 2 View  Result Date: 12/17/2016 CLINICAL DATA:  Shortness of breath, headache EXAM: CHEST  2 VIEW COMPARISON:  None. FINDINGS: Lungs are clear.  No pleural effusion or pneumothorax. The heart is normal in size. Visualized osseous structures  are within normal limits. IMPRESSION: Normal chest radiographs. Electronically Signed   By: Julian Hy M.D.   On: 12/17/2016 16:06    Telemetry showing sinus bradycardia   Subjective: Patient very upset and demands to discuss matter of lab error with someone from billing.  Did not want to discuss any further medical workup or investigation.  Discharge Exam: Vitals:   12/18/16 0358 12/18/16 0632  BP: 122/70 132/77  Pulse: (!) 56 (!) 48  Resp: 18 18  Temp: 98.4 F (36.9 C) 97.4 F (36.3 C)   Vitals:   12/18/16 0048 12/18/16 0320 12/18/16 0358 12/18/16 0632  BP: 114/75 116/72 122/70 132/77  Pulse: (!) 50 (!) 54 (!) 56 (!) 48  Resp: 18 20 18 18   Temp: 98.4 F (36.9 C) 98.3 F (36.8 C) 98.4 F (36.9 C) 97.4 F (36.3 C)  TempSrc: Oral Oral Oral Oral  SpO2:  100% 100% 97%  Weight:       Height:        General: Pt is alert, awake, angry Cardiovascular: did not allow me to perform exam Respiratory: patient did not allow me to perform exam Abdominal: did not allow me to perform exam Extremities: patient able to move arms and legs independently    The results of significant diagnostics from this hospitalization (including imaging, microbiology, ancillary and laboratory) are listed below for reference.     Microbiology: No results found for this or any previous visit (from the past 240 hour(s)).   Labs: BNP (last 3 results) No results for input(s): BNP in the last 8760 hours. Basic Metabolic Panel: No results for input(s): NA, K, CL, CO2, GLUCOSE, BUN, CREATININE, CALCIUM, MG, PHOS in the last 168 hours. Liver Function Tests: No results for input(s): AST, ALT, ALKPHOS, BILITOT, PROT, ALBUMIN in the last 168 hours. No results for input(s): LIPASE, AMYLASE in the last 168 hours. No results for input(s): AMMONIA in the last 168 hours. CBC: No results for input(s): WBC, NEUTROABS, HGB, HCT, MCV, PLT in the last 168 hours. Cardiac Enzymes: No results for input(s): CKTOTAL, CKMB, CKMBINDEX, TROPONINI in the last 168 hours. BNP: Invalid input(s): POCBNP CBG: No results for input(s): GLUCAP in the last 168 hours. D-Dimer No results for input(s): DDIMER in the last 72 hours. Hgb A1c No results for input(s): HGBA1C in the last 72 hours. Lipid Profile No results for input(s): CHOL, HDL, LDLCALC, TRIG, CHOLHDL, LDLDIRECT in the last 72 hours. Thyroid function studies No results for input(s): TSH, T4TOTAL, T3FREE, THYROIDAB in the last 72 hours.  Invalid input(s): FREET3 Anemia work up No results for input(s): VITAMINB12, FOLATE, FERRITIN, TIBC, IRON, RETICCTPCT in the last 72 hours. Urinalysis    Component Value Date/Time   COLORURINE RED (A) 08/26/2016 1253   APPEARANCEUR TURBID (A) 08/26/2016 1253   LABSPEC 1.027 08/26/2016 1253   PHURINE 5.0 08/26/2016  1253   GLUCOSEU NEGATIVE 08/26/2016 1253   HGBUR LARGE (A) 08/26/2016 1253   BILIRUBINUR MODERATE (A) 08/26/2016 1253   KETONESUR 15 (A) 08/26/2016 1253   PROTEINUR 100 (A) 08/26/2016 1253   UROBILINOGEN 0.2 02/06/2012 0258   NITRITE NEGATIVE 08/26/2016 1253   LEUKOCYTESUR SMALL (A) 08/26/2016 1253   Sepsis Labs Invalid input(s): PROCALCITONIN,  WBC,  LACTICIDVEN Microbiology No results found for this or any previous visit (from the past 240 hour(s)).   Time coordinating discharge: less than 30 minutes  SIGNED:   Loretha Stapler, MD  [facility] 01/08/2017, 11:45 AM Pager [phone] If 7PM-7AM, please contact night-coverage  [email or web address] Password [email or web address]

## 2017-01-14 DIAGNOSIS — R0681 Apnea, not elsewhere classified: Secondary | ICD-10-CM | POA: Diagnosis not present

## 2017-01-14 DIAGNOSIS — R5383 Other fatigue: Secondary | ICD-10-CM | POA: Diagnosis not present

## 2017-01-24 DIAGNOSIS — Z0000 Encounter for general adult medical examination without abnormal findings: Secondary | ICD-10-CM | POA: Diagnosis not present

## 2017-01-28 DIAGNOSIS — E782 Mixed hyperlipidemia: Secondary | ICD-10-CM | POA: Diagnosis not present

## 2017-01-28 DIAGNOSIS — D696 Thrombocytopenia, unspecified: Secondary | ICD-10-CM | POA: Diagnosis not present

## 2017-01-28 DIAGNOSIS — Z0001 Encounter for general adult medical examination with abnormal findings: Secondary | ICD-10-CM | POA: Diagnosis not present

## 2017-03-12 DIAGNOSIS — M79671 Pain in right foot: Secondary | ICD-10-CM | POA: Diagnosis not present

## 2017-03-12 DIAGNOSIS — M25571 Pain in right ankle and joints of right foot: Secondary | ICD-10-CM | POA: Diagnosis not present

## 2017-06-14 DIAGNOSIS — S93491D Sprain of other ligament of right ankle, subsequent encounter: Secondary | ICD-10-CM | POA: Diagnosis not present

## 2017-08-30 DIAGNOSIS — R070 Pain in throat: Secondary | ICD-10-CM | POA: Diagnosis not present

## 2018-02-17 ENCOUNTER — Encounter: Payer: Self-pay | Admitting: Family Medicine

## 2018-04-14 DIAGNOSIS — R7301 Impaired fasting glucose: Secondary | ICD-10-CM | POA: Diagnosis not present

## 2018-04-14 DIAGNOSIS — I10 Essential (primary) hypertension: Secondary | ICD-10-CM | POA: Diagnosis not present

## 2018-04-14 DIAGNOSIS — E782 Mixed hyperlipidemia: Secondary | ICD-10-CM | POA: Diagnosis not present

## 2018-04-22 DIAGNOSIS — Z0001 Encounter for general adult medical examination with abnormal findings: Secondary | ICD-10-CM | POA: Diagnosis not present

## 2018-04-22 DIAGNOSIS — R7301 Impaired fasting glucose: Secondary | ICD-10-CM | POA: Diagnosis not present

## 2018-04-22 DIAGNOSIS — E782 Mixed hyperlipidemia: Secondary | ICD-10-CM | POA: Diagnosis not present

## 2018-04-23 ENCOUNTER — Encounter (INDEPENDENT_AMBULATORY_CARE_PROVIDER_SITE_OTHER): Payer: Self-pay | Admitting: *Deleted

## 2018-05-12 ENCOUNTER — Other Ambulatory Visit: Payer: Self-pay | Admitting: Urology

## 2018-05-12 DIAGNOSIS — R310 Gross hematuria: Secondary | ICD-10-CM | POA: Diagnosis not present

## 2018-05-12 DIAGNOSIS — N201 Calculus of ureter: Secondary | ICD-10-CM | POA: Diagnosis not present

## 2018-05-12 DIAGNOSIS — N23 Unspecified renal colic: Secondary | ICD-10-CM | POA: Diagnosis not present

## 2018-05-12 DIAGNOSIS — N202 Calculus of kidney with calculus of ureter: Secondary | ICD-10-CM | POA: Diagnosis not present

## 2018-05-13 ENCOUNTER — Other Ambulatory Visit: Payer: Self-pay | Admitting: Urology

## 2018-05-13 DIAGNOSIS — N23 Unspecified renal colic: Secondary | ICD-10-CM | POA: Diagnosis not present

## 2018-05-13 DIAGNOSIS — N201 Calculus of ureter: Secondary | ICD-10-CM | POA: Diagnosis not present

## 2018-05-15 ENCOUNTER — Other Ambulatory Visit: Payer: Self-pay

## 2018-05-15 ENCOUNTER — Encounter (HOSPITAL_COMMUNITY)
Admission: RE | Disposition: A | Discharge status: Home / Self Care | Payer: Self-pay | Source: Ambulatory Visit | Attending: Urology

## 2018-05-15 ENCOUNTER — Ambulatory Visit (HOSPITAL_COMMUNITY)
Admission: RE | Admit: 2018-05-15 | Discharge: 2018-05-15 | Disposition: A | Discharge status: Home / Self Care | Payer: 59 | Source: Ambulatory Visit | Attending: Urology | Admitting: Urology

## 2018-05-15 ENCOUNTER — Encounter (HOSPITAL_COMMUNITY): Payer: Self-pay | Admitting: *Deleted

## 2018-05-15 ENCOUNTER — Ambulatory Visit (HOSPITAL_COMMUNITY): Payer: 59

## 2018-05-15 DIAGNOSIS — N135 Crossing vessel and stricture of ureter without hydronephrosis: Secondary | ICD-10-CM

## 2018-05-15 DIAGNOSIS — N201 Calculus of ureter: Secondary | ICD-10-CM | POA: Insufficient documentation

## 2018-05-15 DIAGNOSIS — Z87442 Personal history of urinary calculi: Secondary | ICD-10-CM | POA: Diagnosis not present

## 2018-05-15 DIAGNOSIS — Z87891 Personal history of nicotine dependence: Secondary | ICD-10-CM | POA: Diagnosis not present

## 2018-05-15 DIAGNOSIS — R310 Gross hematuria: Secondary | ICD-10-CM | POA: Diagnosis not present

## 2018-05-15 DIAGNOSIS — N50812 Left testicular pain: Secondary | ICD-10-CM | POA: Insufficient documentation

## 2018-05-15 DIAGNOSIS — N200 Calculus of kidney: Secondary | ICD-10-CM | POA: Diagnosis not present

## 2018-05-15 HISTORY — PX: EXTRACORPOREAL SHOCK WAVE LITHOTRIPSY: SHX1557

## 2018-05-15 SURGERY — LITHOTRIPSY, ESWL
Anesthesia: LOCAL | Laterality: Left

## 2018-05-15 MED ORDER — DIAZEPAM 5 MG PO TABS
10.0000 mg | ORAL_TABLET | ORAL | Status: AC
  Administered 2018-05-15: 10 mg via ORAL
  Filled 2018-05-15: qty 2

## 2018-05-15 MED ORDER — SODIUM CHLORIDE 0.9 % IV SOLN
INTRAVENOUS | Status: DC
  Administered 2018-05-15: 10:00:00 via INTRAVENOUS

## 2018-05-15 MED ORDER — CIPROFLOXACIN HCL 500 MG PO TABS
500.0000 mg | ORAL_TABLET | ORAL | Status: AC
  Administered 2018-05-15: 500 mg via ORAL
  Filled 2018-05-15: qty 1

## 2018-05-15 MED ORDER — DIPHENHYDRAMINE HCL 25 MG PO CAPS
25.0000 mg | ORAL_CAPSULE | ORAL | Status: AC
  Administered 2018-05-15: 25 mg via ORAL
  Filled 2018-05-15: qty 1

## 2018-05-15 NOTE — Discharge Instructions (Signed)
See [facility] discharge instructions in chart.  

## 2018-05-15 NOTE — H&P (Signed)
CC: left flank pain   HPI: Matthew Bradford is a 57 year old male with a history of kidney stones. He presents to the office today with a one week history of intermittent, sharp, left flank pain with radiation into the left testicle and with no specific modifying factors. He does report passing a 5 mm stone on 05/06/18, which was associated with intermittent episodes of gross hematuria and left testicular pain. UA today is positive for blood and uric acid crystals. He denies N/V/F/C. AFVSS in the office today.     ALLERGIES: No Allergies    MEDICATIONS: Tamsulosin Hcl 0.4 mg capsule     Notes: pt states he stopped vitamins because he thought they caused the kidney stones. no medications per the patient    GU PSH: Cysto Bladder Stone <2.5cm - 09/07/2016 Cystoscopy - 08/29/2016 ESWL - 2015-01-01, December 31, 2008      [place] Notes: Hand Surgery-left finger 04/2016   NON-GU PSH: Remove Tonsils - 12-31-08    GU PMH: Bladder Stone, He has a 15 mm bladder stone that is symptomatic. - 08/29/2016 Ureteral calculus, It looks as if he has passed the ureteral stone. - 08/29/2016, Left, It appears he has passed stone. Will send stone fragments for analysis , - 06/19/2016, Ureteral calculus, left, - Jan 01, 2015 Microscopic hematuria, Secondary to distal left ureteral calculus. - 06/19/2016 Renal calculus - 2016/01/01, 2016-01-01, 01-01-16, Left nephrolithiasis, - 01/01/2015 Dorsalgia, Unspec - 2016/01/01 Encounter for Prostate Cancer screening - January 01, 2016 Other dorsalgia - Jan 01, 2016 History of urolithiasis, Nephrolithiasis - 12/31/2012 LLQ pain, Abdominal Pain In The Left Lower Belly (LLQ) - 2012-12-31 Other microscopic hematuria, Microscopic Hematuria - 12-31-2012 RLQ pain, Abdominal Pain In The Right Lower Belly (RLQ) - Dec 31, 2012    NON-GU PMH: Encounter for general adult medical examination without abnormal findings, Encounter for preventive health examination - January 01, 2015 Hypercalciuria, Hypercalciuria - 12-31-12    FAMILY HISTORY: Death - Father, Mother Family Health Status - Father alive at  age 65 - 55 In Family Family Health Status - Mother's Age - Runs In Family Family Health Status Number - Runs In Family nephrolithiasis - Grandfather, Father, Uncle   SOCIAL HISTORY: Marital Status: Married Preferred Language: English; Race: White Current Smoking Status: Patient does not smoke anymore. Has not smoked since 02/22/2010.  Has never drank.  Drinks 3 caffeinated drinks per day. Patient's occupation Landscape architect.[employer]    REVIEW OF SYSTEMS:    GU Review Male:   Patient denies frequent urination, hard to postpone urination, burning/ pain with urination, get up at night to urinate, leakage of urine, stream starts and stops, trouble starting your stream, have to strain to urinate , erection problems, and penile pain.  Gastrointestinal (Upper):   Patient denies nausea, vomiting, and indigestion/ heartburn.  Gastrointestinal (Lower):   Patient denies diarrhea and constipation.  Constitutional:   Patient denies fever, night sweats, fatigue, and weight loss.  Skin:   Patient denies skin rash/ lesion and itching.  Eyes:   Patient denies blurred vision and double vision.  Ears/ Nose/ Throat:   Patient denies sore throat and sinus problems.  Hematologic/Lymphatic:   Patient denies swollen glands and easy bruising.  Cardiovascular:   Patient denies leg swelling and chest pains.  Respiratory:   Patient denies cough and shortness of breath.  Endocrine:   Patient denies excessive thirst.  Musculoskeletal:   Patient denies back pain and joint pain.  Neurological:   Patient denies headaches and dizziness.  Psychologic:   Patient denies depression and  anxiety.   Notes: pt has seen blood in urine. states yesterday was the worst. pt having flank pain on the left and left testicle.     VITAL SIGNS:      05/12/2018 10:21 AM  Weight 180 lb / 81.65 kg  Height 70 in / 177.8 cm  BP 147/74 mmHg  Pulse 54 /min  Temperature 98.4 F / 36.8 C  BMI 25.8 kg/m   GU PHYSICAL EXAMINATION:     Scrotum: No lesions. No edema. No cysts. No warts.  Epididymides: Right: no spermatocele, no masses, no cysts, no tenderness, no induration, no enlargement. Left: no spermatocele, no masses, no cysts, no tenderness, no induration, no enlargement.  Testes: No tenderness, no swelling, no enlargement left testes. No tenderness, no swelling, no enlargement right testes. Normal location left testes. Normal location right testes. No mass, no cyst, no varicocele, no hydrocele left testes. No mass, no cyst, no varicocele, no hydrocele right testes.  Urethral Meatus: Normal size. No lesion, no wart, no discharge, no polyp. Normal location.  Penis: Circumcised, no warts, no cracks. No dorsal Peyronie's plaques, no left corporal Peyronie's plaques, no right corporal Peyronie's plaques, no scarring, no warts. No balanitis, no meatal stenosis.   MULTI-SYSTEM PHYSICAL EXAMINATION:    Constitutional: Well-nourished. No physical deformities. Normally developed. Good grooming.  Neck: Neck symmetrical, not swollen. Normal tracheal position.  Respiratory: No labored breathing, no use of accessory muscles.   Cardiovascular: Normal temperature, normal extremity pulses, no swelling, no varicosities.  Lymphatic: No enlargement of neck, axillae, groin.  Skin: No paleness, no jaundice, no cyanosis. No lesion, no ulcer, no rash.  Neurologic / Psychiatric: Oriented to time, oriented to place, oriented to person. No depression, no anxiety, no agitation.  Gastrointestinal: No mass, no tenderness, no rigidity, non obese abdomen.  Eyes: Normal conjunctivae. Normal eyelids.  Ears, Nose, Mouth, and Throat: Left ear no scars, no lesions, no masses. Right ear no scars, no lesions, no masses. Nose no scars, no lesions, no masses. Normal hearing. Normal lips.  Musculoskeletal: Normal gait and station of head and neck.     PAST DATA REVIEWED:  Source Of History:  Patient   03/30/16  PSA  Total PSA 1.00     PROCEDURES:          C.T. Urogram - [id number]               Urinalysis w/Scope Dipstick Dipstick Cont'd Micro  Color: Brown Bilirubin: Neg mg/dL WBC/hpf: 0 - 5/hpf  Appearance: Turbid Ketones: Neg mg/dL RBC/hpf: >60/hpf  Specific Gravity: 1.025 Blood: 3+ ery/uL Bacteria: Few (10-25/hpf)  pH: <=5.0 Protein: 3+ mg/dL Cystals: Uric Acid  Glucose: Neg mg/dL Urobilinogen: 0.2 mg/dL Casts: NS (Not Seen)    Nitrites: Neg Trichomonas: Not Present    Leukocyte Esterase: Neg leu/uL Mucous: Not Present      Epithelial Cells: NS (Not Seen)      Yeast: NS (Not Seen)      Sperm: Not Present    Notes: microscopic performed on unconcentrated urine    ASSESSMENT:      ICD-10 Details  1 GU:   Renal colic - G25 Left  2   Left testicular pain - N50.812 -Likely radiating pain from his left UPJ stone  3   Gross hematuria - R31.0   4   History of urolithiasis - Z87.442    PLAN:           Orders Labs Urine Culture  X-Rays: C.T. Stone Protocol Without  Contrast  X-Ray Notes: History:  Hematuria: Yes/No  Patient to see MD after exam: Yes/No  Previous exam: CT / IVP/ US/ KUB/ None  When:  Where:  Diabetic: Yes/ No  BUN/ Creatinine:  Date of last BUN Creatinine:  Weight in pounds:  Allergy- IV Contrast: Yes/ No  Conflicting diabetic meds: Yes/ No  Diabetic Meds:  Prior Authorization #: [id number] per [facility]          Schedule Return Visit/Planned Activity: ASAP - Schedule Surgery          Document Letter(s):  Created for Matthew Flaming, MD   Created for Patient: Clinical Summary         Notes:   -CTSS today shows a 7.5 mm left UPJ calculus with mild left-sided hydronephrosis. The calcification is clearly seen on scout image. Findings discussed with the patient.   -We discussed the management of kidney stones. These options include observation, ureteroscopy, shockwave lithotripsy (ESWL) and percutaneous nephrolithotomy (PCNL). We discussed which options are relevant to the patient's stone(s). We  discussed the natural history of kidney stones as well as the complications of untreated stones and the impact on quality of life without treatment as well as with each of the above listed treatments. We also discussed the efficacy of each treatment in its ability to clear the stone burden. With any of these management options I discussed the signs and symptoms of infection and the need for emergent treatment should these be experienced. For each option we discussed the ability of each procedure to clear the patient of their stone burden.   For observation I described the risks which include but are not limited to silent renal damage, life-threatening infection, need for emergent surgery, failure to pass stone and pain.   For ureteroscopy I described the risks which include bleeding, infection, damage to contiguous structures, positioning injury, ureteral stricture, ureteral avulsion, ureteral injury, need for prolonged ureteral stent, inability to perform ureteroscopy, need for an interval procedure, inability to clear stone burden, stent discomfort/pain, heart attack, stroke, pulmonary embolus and the inherent risks with general anesthesia.   For shockwave lithotripsy I described the risks which include arrhythmia, kidney contusion, kidney hemorrhage, need for transfusion, pain, inability to adequately break up stone, inability to pass stone fragments, Steinstrasse, infection associated with obstructing stones, need for alternate surgical procedure, need for repeat shockwave lithotripsy, MI, CVA, PE and the inherent risks with anesthesia/conscious sedation.   The patient has elected to move forward with left ESWL. Risks, benefits and alternatives were discussed with the patient. He voices understanding and wishes to proceed. We discussed criteria to return to clinic or proceed to the ER which include: Fever/chills, worsening pain, nausea/vomiting and/or persistent gross hematuria. He voices understanding.

## 2018-07-14 ENCOUNTER — Encounter (HOSPITAL_COMMUNITY): Payer: Self-pay | Admitting: Urology

## 2018-09-04 DIAGNOSIS — X32XXXA Exposure to sunlight, initial encounter: Secondary | ICD-10-CM | POA: Diagnosis not present

## 2018-09-04 DIAGNOSIS — L570 Actinic keratosis: Secondary | ICD-10-CM | POA: Diagnosis not present

## 2018-09-04 DIAGNOSIS — L814 Other melanin hyperpigmentation: Secondary | ICD-10-CM | POA: Diagnosis not present

## 2019-04-15 ENCOUNTER — Encounter (HOSPITAL_COMMUNITY): Payer: Self-pay

## 2019-04-15 ENCOUNTER — Emergency Department (HOSPITAL_COMMUNITY)
Admission: EM | Admit: 2019-04-15 | Discharge: 2019-04-15 | Disposition: A | Discharge status: Home / Self Care | Payer: 59 | Attending: Emergency Medicine | Admitting: Emergency Medicine

## 2019-04-15 ENCOUNTER — Emergency Department (HOSPITAL_COMMUNITY): Payer: 59

## 2019-04-15 ENCOUNTER — Other Ambulatory Visit: Payer: Self-pay

## 2019-04-15 DIAGNOSIS — R002 Palpitations: Secondary | ICD-10-CM | POA: Diagnosis present

## 2019-04-15 LAB — COMPREHENSIVE METABOLIC PANEL
ALT: 21 U/L (ref 0–44)
AST: 20 U/L (ref 15–41)
Albumin: 3.9 g/dL (ref 3.5–5.0)
Alkaline Phosphatase: 59 U/L (ref 38–126)
Anion gap: 8 (ref 5–15)
BUN: 14 mg/dL (ref 6–20)
CO2: 26 mmol/L (ref 22–32)
Calcium: 8.8 mg/dL — ABNORMAL LOW (ref 8.9–10.3)
Chloride: 105 mmol/L (ref 98–111)
Creatinine, Ser: 1.1 mg/dL (ref 0.61–1.24)
GFR calc Af Amer: >60 mL/min (ref >60)
GFR calc non Af Amer: >60 mL/min (ref >60)
Glucose, Bld: 111 mg/dL — ABNORMAL HIGH (ref 70–99)
Potassium: 4 mmol/L (ref 3.5–5.1)
Sodium: 139 mmol/L (ref 135–145)
Total Bilirubin: 1 mg/dL (ref 0.3–1.2)
Total Protein: 6.5 g/dL (ref 6.5–8.1)

## 2019-04-15 LAB — CBC WITH DIFFERENTIAL/PLATELET
Abs Immature Granulocytes: 0.02 10*3/uL (ref 0.00–0.07)
Basophils Absolute: 0 10*3/uL (ref 0.0–0.1)
Basophils Relative: 1 %
Eosinophils Absolute: 0.1 10*3/uL (ref 0.0–0.5)
Eosinophils Relative: 3 %
HCT: 40.4 % (ref 39.0–52.0)
Hemoglobin: 14.4 g/dL (ref 13.0–17.0)
Immature Granulocytes: 1 %
Lymphocytes Relative: 30 %
Lymphs Abs: 1.3 10*3/uL (ref 0.7–4.0)
MCH: 32.8 pg (ref 26.0–34.0)
MCHC: 35.6 g/dL (ref 30.0–36.0)
MCV: 92 fL (ref 80.0–100.0)
Monocytes Absolute: 0.4 10*3/uL (ref 0.1–1.0)
Monocytes Relative: 8 %
Neutro Abs: 2.6 10*3/uL (ref 1.7–7.7)
Neutrophils Relative %: 57 %
Platelets: 145 10*3/uL — ABNORMAL LOW (ref 150–400)
RBC: 4.39 MIL/uL (ref 4.22–5.81)
RDW: 12.2 % (ref 11.5–15.5)
WBC: 4.4 10*3/uL (ref 4.0–10.5)
nRBC: 0 % (ref 0.0–0.2)

## 2019-04-15 LAB — TROPONIN I (HIGH SENSITIVITY)
Troponin I (High Sensitivity): 4 ng/L (ref <18)
Troponin I (High Sensitivity): 4 ng/L (ref <18)

## 2019-04-15 MED ORDER — ATENOLOL 25 MG PO TABS: ORAL_TABLET | ORAL | 0 refills | Status: DC

## 2019-04-15 NOTE — ED Provider Notes (Signed)
[facility] EMERGENCY DEPARTMENT Provider Note   CSN: [id number] Arrival date & time: 04/15/19  4128     History   Chief Complaint Chief Complaint  Patient presents with  . Palpitations    HPI Matthew Bradford is a 58 y.o. male.     Patient complains of palpitation  The history is provided by the patient. No language interpreter was used.  Palpitations Palpitations quality:  Regular Onset quality:  Sudden Timing:  Constant Progression:  Worsening Chronicity:  New Context: caffeine   Context: not anxiety   Relieved by:  Nothing Worsened by:  Nothing Ineffective treatments:  None tried Associated symptoms: no back pain, no chest pain and no cough     Past Medical History:  Diagnosis Date  . Bladder stone   . Frequency of urination   . Hematuria   . History of cellulitis    03-2016  finger  . History of kidney stones   . Nephrolithiasis   . Urgency of urination   . Wears glasses     Patient Active Problem List   Diagnosis Date Noted  . Anemia 12/17/2016  . History of nephrolithiasis 04/09/2016  . BMI 26.0-26.9,adult 03/31/2016  . Nephrolithiasis 03/31/2016    Past Surgical History:  Procedure Laterality Date  . CYSTOSCOPY WITH LITHOLAPAXY N/A 09/07/2016   Procedure: CYSTOSCOPY WITH LITHOLAPAXY;  Surgeon: Franchot Gallo, MD;  Location: [facility];  Service: Urology;  Laterality: N/A;  . CYSTOSCOPY/RETROGRADE/URETEROSCOPY/STONE EXTRACTION WITH BASKET  1996  . EXTRACORPOREAL SHOCK WAVE LITHOTRIPSY  11/29/2014  . EXTRACORPOREAL SHOCK WAVE LITHOTRIPSY Left 05/15/2018   Procedure: LEFT EXTRACORPOREAL SHOCK WAVE LITHOTRIPSY (ESWL);  Surgeon: Ardis Hughs, MD;  Location: [facility];  Service: Urology;  Laterality: Left;  . HOLMIUM LASER APPLICATION N/A 78/67/6720   Procedure: HOLMIUM LASER APPLICATION;  Surgeon: Franchot Gallo, MD;  Location: [facility];  Service: Urology;  Laterality: N/A;  . I&D EXTREMITY Left 05/09/2016    Procedure: IRRIGATION AND DEBRIDEMENT LEFT INDEX FINGER;  Surgeon: Leandrew Koyanagi, MD;  Location: [place];  Service: Orthopedics;  Laterality: Left;  . TONSILLECTOMY  1979        Home Medications    Prior to Admission medications   Medication Sig Start Date End Date Taking? Authorizing Provider  acetaminophen (TYLENOL) 500 MG tablet Take 1,000 mg by mouth every 4 (four) hours as needed for headache.   Yes [provider]  ALPRAZolam Duanne Moron) 0.5 MG tablet Take 1 tablet by mouth daily as needed. 01/07/19  Yes [provider]  atenolol (TENORMIN) 25 MG tablet Take one half of a tablet once a day as needed for palpitations 04/15/19   Milton Ferguson, MD    Family History No family history on file.  Social History Social History   Tobacco Use  . Smoking status: Former Smoker    Years: 30.00    Types: Cigarettes    Quit date: 12/23/2009    Years since quitting: 9.3  . Smokeless tobacco: Current User    Types: Chew  . Tobacco comment: occasional chew tobacco  Substance Use Topics  . Alcohol use: No  . Drug use: No     Allergies   Ibuprofen   Review of Systems Review of Systems  Constitutional: Negative for appetite change and fatigue.  HENT: Negative for congestion, ear discharge and sinus pressure.   Eyes: Negative for discharge.  Respiratory: Negative for cough.   Cardiovascular: Positive for palpitations. Negative for chest pain.  Gastrointestinal: Negative for abdominal pain and diarrhea.  Genitourinary: Negative for frequency and hematuria.  Musculoskeletal: Negative for back pain.  Skin: Negative for rash.  Neurological: Negative for seizures and headaches.  Psychiatric/Behavioral: Negative for hallucinations.     Physical Exam Updated Vital Signs BP 120/77   Pulse (!) 43   Temp 98.1 F (36.7 C) (Oral)   Resp 12   Ht 5\' 10"  (1.778 m)   Wt 81.6 kg   SpO2 97%   BMI 25.83 kg/m   Physical Exam Vitals signs and nursing  note reviewed.  Constitutional:      Appearance: He is well-developed.  HENT:     Head: Normocephalic.     Nose: Nose normal.  Eyes:     General: No scleral icterus.    Conjunctiva/sclera: Conjunctivae normal.  Neck:     Musculoskeletal: Neck supple.     Thyroid: No thyromegaly.  Cardiovascular:     Rate and Rhythm: Normal rate and regular rhythm.     Heart sounds: No murmur. No friction rub. No gallop.   Pulmonary:     Breath sounds: No stridor. No wheezing or rales.  Chest:     Chest wall: No tenderness.  Abdominal:     General: There is no distension.     Tenderness: There is no abdominal tenderness. There is no rebound.  Musculoskeletal: Normal range of motion.  Lymphadenopathy:     Cervical: No cervical adenopathy.  Skin:    Findings: No erythema or rash.  Neurological:     Mental Status: He is oriented to person, place, and time.     Motor: No abnormal muscle tone.     Coordination: Coordination normal.  Psychiatric:        Behavior: Behavior normal.      ED Treatments / Results  Labs (all labs ordered are listed, but only abnormal results are displayed) Labs Reviewed  CBC WITH DIFFERENTIAL/PLATELET - Abnormal; Notable for the following components:      Result Value   Platelets 145 (*)    All other components within normal limits  COMPREHENSIVE METABOLIC PANEL - Abnormal; Notable for the following components:   Glucose, Bld 111 (*)    Calcium 8.8 (*)    All other components within normal limits  SARS CORONAVIRUS 2 (HOSPITAL ORDER, [facility])  TROPONIN I (HIGH SENSITIVITY)  TROPONIN I (HIGH SENSITIVITY)    EKG None  Radiology Dg Chest Portable 1 View  Result Date: 04/15/2019 CLINICAL DATA:  Shortness of breath, palpitations EXAM: PORTABLE CHEST 1 VIEW COMPARISON:  12/17/2016 FINDINGS: The heart size and mediastinal contours are within normal limits. Both lungs are clear. The visualized skeletal structures are unremarkable.  IMPRESSION: No acute abnormality of the lungs in AP portable projection. Electronically Signed   By: Eddie Candle M.D.   On: 04/15/2019 10:53    Procedures Procedures (including critical care time)  Medications Ordered in ED Medications - No data to display   Initial Impression / Assessment and Plan / ED Course  I have reviewed the triage vital signs and the nursing notes.  Pertinent labs & imaging results that were available during my care of the patient were reviewed by me and considered in my medical decision making (see chart for details).       Patient was noted to have unifocal PVCs.  Patient initially was going to be given a small dose of Tenormin but after speaking with him longer we will not give him  the term ointment.  His pulse was sitting around 50 most of the visit.  He has unifocal PVCs.  Patient was told to stop caffeine and tobacco products and he will follow-up with cardiology  Final Clinical Impressions(s) / ED Diagnoses   Final diagnoses:  Palpitations    ED Discharge Orders         Ordered    atenolol (TENORMIN) 25 MG tablet     04/15/19 1448           Milton Ferguson, MD 04/15/19 1846

## 2019-04-15 NOTE — Discharge Instructions (Signed)
Follow-up with Dr. Harl Bowie or 1 of his partners in the next couple weeks

## 2019-04-15 NOTE — ED Triage Notes (Signed)
Pt is having heart palpitations that has been going on for 4 weeks. States it is causing pressure and tightness in his chest. Has happened previously 4 years ago. NAD.

## 2019-04-15 NOTE — ED Notes (Signed)
Pt refusing to have Covid-19 testing performed. RN attempted to ask pt what were his concerns and pt stated while raising his hands up and shaking his head no, "I have heard about the corona until I'm sick on my stomach. I don't want anything to do with it". RN attemped to ask again and pt once again stated, "I just don't want anything to do with it". Dr. Roderic Palau notified.

## 2020-02-17 ENCOUNTER — Other Ambulatory Visit: Payer: Self-pay

## 2020-02-17 ENCOUNTER — Telehealth: Payer: Self-pay | Admitting: Urology

## 2020-02-17 ENCOUNTER — Emergency Department (HOSPITAL_COMMUNITY)
Admission: EM | Admit: 2020-02-17 | Discharge: 2020-02-17 | Disposition: A | Discharge status: Home / Self Care | Payer: PRIVATE HEALTH INSURANCE | Attending: Emergency Medicine | Admitting: Emergency Medicine

## 2020-02-17 ENCOUNTER — Encounter (HOSPITAL_COMMUNITY): Payer: Self-pay | Admitting: Emergency Medicine

## 2020-02-17 ENCOUNTER — Emergency Department (HOSPITAL_COMMUNITY): Payer: PRIVATE HEALTH INSURANCE

## 2020-02-17 DIAGNOSIS — R935 Abnormal findings on diagnostic imaging of other abdominal regions, including retroperitoneum: Secondary | ICD-10-CM | POA: Diagnosis not present

## 2020-02-17 DIAGNOSIS — Z87891 Personal history of nicotine dependence: Secondary | ICD-10-CM | POA: Insufficient documentation

## 2020-02-17 DIAGNOSIS — R109 Unspecified abdominal pain: Secondary | ICD-10-CM | POA: Diagnosis present

## 2020-02-17 DIAGNOSIS — N201 Calculus of ureter: Secondary | ICD-10-CM | POA: Diagnosis not present

## 2020-02-17 DIAGNOSIS — N289 Disorder of kidney and ureter, unspecified: Secondary | ICD-10-CM | POA: Diagnosis not present

## 2020-02-17 DIAGNOSIS — Q6211 Congenital occlusion of ureteropelvic junction: Secondary | ICD-10-CM | POA: Diagnosis not present

## 2020-02-17 LAB — URINALYSIS, ROUTINE W REFLEX MICROSCOPIC
Bilirubin Urine: NEGATIVE
Glucose, UA: NEGATIVE mg/dL
Ketones, ur: NEGATIVE mg/dL
Leukocytes,Ua: NEGATIVE
Nitrite: NEGATIVE
Protein, ur: 30 mg/dL — AB
Specific Gravity, Urine: 1.023 (ref 1.005–1.030)
pH: 5 (ref 5.0–8.0)

## 2020-02-17 LAB — CBC
HCT: 44.9 % (ref 39.0–52.0)
Hemoglobin: 16.1 g/dL (ref 13.0–17.0)
MCH: 32.9 pg (ref 26.0–34.0)
MCHC: 35.9 g/dL (ref 30.0–36.0)
MCV: 91.6 fL (ref 80.0–100.0)
Platelets: 177 10*3/uL (ref 150–400)
RBC: 4.9 MIL/uL (ref 4.22–5.81)
RDW: 11.7 % (ref 11.5–15.5)
WBC: 10.3 10*3/uL (ref 4.0–10.5)
nRBC: 0 % (ref 0.0–0.2)

## 2020-02-17 LAB — BASIC METABOLIC PANEL
Anion gap: 10 (ref 5–15)
BUN: 14 mg/dL (ref 6–20)
CO2: 22 mmol/L (ref 22–32)
Calcium: 9.3 mg/dL (ref 8.9–10.3)
Chloride: 105 mmol/L (ref 98–111)
Creatinine, Ser: 1.68 mg/dL — ABNORMAL HIGH (ref 0.61–1.24)
GFR calc Af Amer: 51 mL/min — ABNORMAL LOW (ref >60)
GFR calc non Af Amer: 44 mL/min — ABNORMAL LOW (ref >60)
Glucose, Bld: 135 mg/dL — ABNORMAL HIGH (ref 70–99)
Potassium: 4.2 mmol/L (ref 3.5–5.1)
Sodium: 137 mmol/L (ref 135–145)

## 2020-02-17 MED ORDER — OXYCODONE-ACETAMINOPHEN 5-325 MG PO TABS
1.0000 | ORAL_TABLET | ORAL | Status: DC | PRN
  Filled 2020-02-17: qty 1

## 2020-02-17 MED ORDER — ONDANSETRON 4 MG PO TBDP
8.0000 mg | ORAL_TABLET | Freq: Once | ORAL | Status: DC
  Filled 2020-02-17: qty 2

## 2020-02-17 MED ORDER — HYDROMORPHONE HCL 1 MG/ML IJ SOLN
2.0000 mg | Freq: Once | INTRAMUSCULAR | Status: AC
  Administered 2020-02-17: 2 mg via INTRAMUSCULAR
  Filled 2020-02-17: qty 2

## 2020-02-17 MED ORDER — HYDROCODONE-ACETAMINOPHEN 5-325 MG PO TABS: 1.0000 | ORAL_TABLET | ORAL | 0 refills | Status: DC | PRN

## 2020-02-17 NOTE — ED Notes (Signed)
Patient verbalizes understanding of discharge instructions . Opportunity for questions and answers were provided . Armband removed by staff ,Pt discharged from ED. W/C  offered at D/C  and Declined W/C at D/C and was escorted to lobby by RN.  

## 2020-02-17 NOTE — ED Provider Notes (Signed)
[facility] Provider Note   CSN: [id number] Arrival date & time: 02/17/20  0330     History Chief Complaint  Patient presents with  . Flank Pain    Matthew Bradford is a 59 y.o. male.  HPI He presents for recurrent passage of kidney stone with left flank pain.  He has been passing multiple small stones for the last week, then began to have severe pain left flank yesterday that radiates to the left groin.  He is not using anything at home for the pain.  He has not seen his urologist, "for a while."  He denies fever, chills, nausea, vomiting, weakness or dizziness.  He took 2 doses of Flomax within the last 24 hours without relief.  There are no other known modifying factors.    Past Medical History:  Diagnosis Date  . Bladder stone   . Frequency of urination   . Hematuria   . History of cellulitis    03-2016  finger  . History of kidney stones   . Nephrolithiasis   . Urgency of urination   . Wears glasses     Patient Active Problem List   Diagnosis Date Noted  . Anemia 12/17/2016  . History of nephrolithiasis 04/09/2016  . BMI 26.0-26.9,adult 03/31/2016  . Nephrolithiasis 03/31/2016    Past Surgical History:  Procedure Laterality Date  . CYSTOSCOPY WITH LITHOLAPAXY N/A 09/07/2016   Procedure: CYSTOSCOPY WITH LITHOLAPAXY;  Surgeon: Franchot Gallo, MD;  Location: [facility];  Service: Urology;  Laterality: N/A;  . CYSTOSCOPY/RETROGRADE/URETEROSCOPY/STONE EXTRACTION WITH BASKET  1996  . EXTRACORPOREAL SHOCK WAVE LITHOTRIPSY  11/29/2014  . EXTRACORPOREAL SHOCK WAVE LITHOTRIPSY Left 05/15/2018   Procedure: LEFT EXTRACORPOREAL SHOCK WAVE LITHOTRIPSY (ESWL);  Surgeon: Ardis Hughs, MD;  Location: [facility];  Service: Urology;  Laterality: Left;  . HOLMIUM LASER APPLICATION N/A [id number]   Procedure: HOLMIUM LASER APPLICATION;  Surgeon: Franchot Gallo, MD;  Location: [facility];  Service: Urology;   Laterality: N/A;  . I & D EXTREMITY Left 05/09/2016   Procedure: IRRIGATION AND DEBRIDEMENT LEFT INDEX FINGER;  Surgeon: Leandrew Koyanagi, MD;  Location: [facility];  Service: Orthopedics;  Laterality: Left;  . TONSILLECTOMY  1979       No family history on file.  Social History   Tobacco Use  . Smoking status: Former Smoker    Years: 30.00    Types: Cigarettes    Quit date: 12/23/2009    Years since quitting: 10.1  . Smokeless tobacco: Current User    Types: Chew  . Tobacco comment: occasional chew tobacco  Substance Use Topics  . Alcohol use: No  . Drug use: No    Home Medications Prior to Admission medications   Medication Sig Start Date End Date Taking? Authorizing Provider  acetaminophen (TYLENOL) 500 MG tablet Take 1,000 mg by mouth every 4 (four) hours as needed for headache.    [provider]  atenolol (TENORMIN) 25 MG tablet Take one half of a tablet once a day as needed for palpitations Patient not taking: Reported on 02/17/2020 04/15/19   Milton Ferguson, MD  HYDROcodone-acetaminophen (NORCO/VICODIN) 5-325 MG tablet Take 1 tablet by mouth every 4 (four) hours as needed for moderate pain. 02/17/20   Daleen Bo, MD    Allergies    Ibuprofen  Review of Systems   Review of Systems  All other systems reviewed and are negative.   Physical Exam Updated Vital Signs  BP 131/81   Pulse (!) 46   Temp 98.6 F (37 C) (Oral)   Resp 12   Ht 5\' 10"  (1.778 m)   Wt 81.6 kg   SpO2 97%   BMI 25.81 kg/m   Physical Exam Vitals and nursing note reviewed.  Constitutional:      Appearance: He is well-developed.  HENT:     Head: Normocephalic and atraumatic.     Right Ear: External ear normal.     Left Ear: External ear normal.  Eyes:     Conjunctiva/sclera: Conjunctivae normal.     Pupils: Pupils are equal, round, and reactive to light.  Neck:     Trachea: Phonation normal.  Cardiovascular:     Rate and Rhythm: Normal rate.  Pulmonary:      Effort: Pulmonary effort is normal.  Abdominal:     General: There is no distension.     Palpations: Abdomen is soft.     Tenderness: There is no abdominal tenderness.  Genitourinary:    Comments: Mild left flank tenderness Musculoskeletal:        General: Normal range of motion.     Cervical back: Normal range of motion and neck supple.  Skin:    General: Skin is warm and dry.  Neurological:     Mental Status: He is alert and oriented to person, place, and time.     Cranial Nerves: No cranial nerve deficit.     Sensory: No sensory deficit.     Motor: No abnormal muscle tone.     Coordination: Coordination normal.  Psychiatric:        Mood and Affect: Mood normal.        Behavior: Behavior normal.        Thought Content: Thought content normal.        Judgment: Judgment normal.     ED Results / Procedures / Treatments   Labs (all labs ordered are listed, but only abnormal results are displayed) Labs Reviewed  BASIC METABOLIC PANEL - Abnormal; Notable for the following components:      Result Value   Glucose, Bld 135 (*)    Creatinine, Ser 1.68 (*)    GFR calc non Af Amer 44 (*)    GFR calc Af Amer 51 (*)    All other components within normal limits  URINALYSIS, ROUTINE W REFLEX MICROSCOPIC - Abnormal; Notable for the following components:   APPearance CLOUDY (*)    Hgb urine dipstick MODERATE (*)    Protein, ur 30 (*)    Bacteria, UA RARE (*)    All other components within normal limits  CBC    EKG None  Radiology CT Renal Stone Study  Result Date: 02/17/2020 CLINICAL DATA:  Flank pain, stone disease suspected, left flank pain. History of urolithiasis. EXAM: CT ABDOMEN AND PELVIS WITHOUT CONTRAST TECHNIQUE: Multidetector CT imaging of the abdomen and pelvis was performed following the standard protocol without IV contrast. COMPARISON:  CT 05/12/2018 FINDINGS: Lower chest: Bandlike areas of opacity, likely atelectasis or scarring. Otherwise clear lung bases. Normal  cardiac size. Trace pericardial effusion slightly increased from prior. Hepatobiliary: No focal liver abnormality is seen. No gallstones, gallbladder wall thickening, or biliary dilatation. Pancreas: Unremarkable. No pancreatic ductal dilatation or surrounding inflammatory changes. Spleen: Normal in size without focal abnormality. Adrenals/Urinary Tract: Normal adrenal glands. Hydroureteronephrosis to the level of a 5 mm calculus in the distal left ureter just proximal to the left ureterovesicular junction (3/76). There is asymmetric stranding about  the left kidney and ureter, most pronounced within the perirenal space with additional stranding and likely reactive free fluid in the left anterior and posterior pararenal spaces. Additional punctate nonobstructing calculus seen in the interpolar left kidney. No right urolithiasis or urinary tract dilatation. No visible or contour deforming renal lesions. Urinary bladder is largely decompressed at the time of exam and therefore poorly evaluated by CT imaging. No gross bladder abnormality. Stomach/Bowel: Distal esophagus, stomach and duodenal sweep are unremarkable. No small bowel wall thickening or dilatation. No evidence of obstruction. There is extensive intramural fat within the proximal and distal colon as well as the appendix. No focal periappendiceal fat stranding or inflammation. Trace pericolonic stranding at the splenic flexure and proximal descending colon is likely reactive to the adjacent renal process. No frank colonic wall thickening or dilatation. Scattered colonic diverticula without focal pericolonic inflammation to suggest diverticulitis. Vascular/Lymphatic: Atherosclerotic calcifications throughout the abdominal aorta and branch vessels. No aneurysm or ectasia. No enlarged abdominopelvic lymph nodes. Reproductive: Coarse eccentric calcification of the prostate. No concerning abnormalities of the prostate or seminal vesicles. Other: Retroperitoneal  stranding and trace reactive free fluid, as above. No intraperitoneal free fluid. No abdominopelvic free air. No bowel containing hernias. Small ventral diastasis and fat containing bilateral inguinal hernias. Musculoskeletal: No acute osseous abnormality or suspicious osseous lesion. Multilevel degenerative changes are present in the imaged portions of the spine. Additional mild-to-moderate degenerative changes in the pelvis and hips. IMPRESSION: 1. Hydroureteronephrosis to the level of a 5 mm calculus in the distal left ureter just proximal to the left ureterovesicular junction. Extensive left perinephric and periureteral inflammatory changes, detailed above. 2. Additional punctate nonobstructing calculus in the interpolar left kidney. 3. Extensive intramural fat within the proximal and distal colon as well as the appendix. This finding is nonspecific but can be seen in the setting of chronic inflammatory bowel disease or secondary to obese body habitus. 4. Colonic diverticulosis without evidence of diverticulitis. 5. Trace pericardial effusion slightly increased from prior. 6. Aortic Atherosclerosis (ICD10-I70.0). Electronically Signed   By: Lovena Le M.D.   On: 02/17/2020 06:13    Procedures Procedures (including critical care time)  Medications Ordered in ED Medications  oxyCODONE-acetaminophen (PERCOCET/ROXICET) 5-325 MG per tablet 1 tablet (1 tablet Oral Incomplete 02/17/20 0406)  ondansetron (ZOFRAN-ODT) disintegrating tablet 8 mg (4 mg Oral Refused 02/17/20 0948)  HYDROmorphone (DILAUDID) injection 2 mg (2 mg Intramuscular Given 02/17/20 0948)    ED Course  I have reviewed the triage vital signs and the nursing notes.  Pertinent labs & imaging results that were available during my care of the patient were reviewed by me and considered in my medical decision making (see chart for details).  Clinical Course as of Feb 16 1214  Wed Feb 17, 2020  1054 He states his pain is now completely  resolved.  Patient updated on findings and need for contacting urology.   [EW]    Clinical Course User Index [EW] Daleen Bo, MD   MDM Rules/Calculators/A&P                       Patient Vitals for the past 24 hrs:  BP Temp Temp src Pulse Resp SpO2 Height Weight  02/17/20 1210 -- 98.6 F (37 C) Oral -- -- -- -- --  02/17/20 0945 131/81 -- -- (!) 46 12 97 % -- --  02/17/20 0930 128/84 -- -- (!) 41 19 95 % -- --  02/17/20 0915 119/72 -- -- Marland Kitchen  48 -- 97 % -- --  02/17/20 0900 132/75 -- -- (!) 42 -- 97 % -- --  02/17/20 0845 (!) 144/90 -- -- 99 -- 98 % -- --  02/17/20 0802 (!) 143/72 97.8 F (36.6 C) Oral (!) 57 18 100 % -- --  02/17/20 0401 -- -- -- -- -- -- 5\' 10"  (1.778 m) 81.6 kg  02/17/20 0400 (!) 159/95 98.7 F (37.1 C) Oral 64 18 96 % -- --    12:15 PM Reevaluation with update and discussion. After initial assessment and treatment, an updated evaluation reveals patient's pain is controlled and he is comfortable.  All findings discussed with him and questions were answered. Daleen Bo   Medical Decision Making:  This patient is presenting for evaluation of left flank pain, which does require a range of treatment options, and is a complaint that involves a moderate risk of morbidity and mortality. The differential diagnoses include UTI, obstructing kidney stone, muscle strain. I decided to review old records, and in summary patient with history of frequent kidney stones requiring lithotripsy and occasionally passing on their own..  I did not require additional historical information from anyone.  Clinical Laboratory Tests Ordered, included CBC, Metabolic panel and Urinalysis. Review indicates creatinine slightly elevated 1.7, GFR low, urine with presence of blood, protein and rare bacteria. Radiologic Tests Ordered, included CT abdomen pelvis renal study.  I independently Visualized: Radiographic images, which show 5 mm UVJ stone, with hydronephrosis.  Left kidney with  concern for calyceal rupture, inflammatory changes present.  Nonspecific colon abnormality, possibly chronic inflammatory bowel disease.   I discussed the case and CT results with Dr. Alinda Money, urologist.  He states patient is stable for discharge with outpatient management.  He recommends urology follow-up next week.   Critical Interventions-clinical evaluation, laboratory testing, CT imaging, medication treatment, observation and reassessment.  Case management discussed with urology to arrange follow-up next week.  After These Interventions, the Patient was reevaluated and was found patient pain is been controlled he has a moderately large distal ureter stone, left, and CT which does not show adverse acute complications.  Minor incidental CT abnormalities which can be follow-up as an outpatient.  Patient was verbally and written informed.  CRITICAL CARE-no Performed by: Daleen Bo  Nursing Notes Reviewed/ Care Coordinated Applicable Imaging Reviewed Interpretation of Laboratory Data incorporated into ED treatment  The patient appears reasonably screened and/or stabilized for discharge and I doubt any other medical condition or other [facility] requiring further screening, evaluation, or treatment in the ED at this time prior to discharge.  Plan: Home Medications-continue current medications including Flomax daily; Home Treatments-rest, regular diet; return here if the recommended treatment, does not improve the symptoms; Recommended follow up-urology follow-up next week.     Final Clinical Impression(s) / ED Diagnoses Final diagnoses:  Left ureteral stone  Hydronephrosis with ureteropelvic junction (UPJ) obstruction  Abnormal CT of the abdomen  Renal insufficiency    Rx / DC Orders ED Discharge Orders         Ordered    HYDROcodone-acetaminophen (NORCO/VICODIN) 5-325 MG tablet  Every 4 hours PRN     02/17/20 1213           Daleen Bo, MD 02/17/20 1224

## 2020-02-17 NOTE — Discharge Instructions (Addendum)
You have a 5 mm stone in the left lower ureter, which has a good chance of passing.  Take Flomax daily.  Call your urologist for follow-up appointment next week.  If you need immediate assistance you can go to the [facility] emergency department for care and treatment.  The CAT scan did show a possible colon abnormality which is nonspecific and could indicate chronic problems with your colon.  Please follow-up with your primary care doctor to discuss this sometime in the next few weeks.

## 2020-02-17 NOTE — Telephone Encounter (Signed)
Pt was seen in er for kidney stones. He called asking when he should schedule.

## 2020-02-17 NOTE — ED Triage Notes (Signed)
Pt c/o 10/10 left flank pain that stated at mid night today, hx of frequent kidney stones.

## 2020-02-18 NOTE — Telephone Encounter (Signed)
He really needs to be seen in 1 week with his ct but with MD vacaction schedules and surgery during clinic it wont be possible. I would suggest pt calls [facility] to see if he an be seen there

## 2020-02-23 ENCOUNTER — Encounter (INDEPENDENT_AMBULATORY_CARE_PROVIDER_SITE_OTHER): Payer: Self-pay | Admitting: *Deleted

## 2020-04-07 ENCOUNTER — Other Ambulatory Visit (INDEPENDENT_AMBULATORY_CARE_PROVIDER_SITE_OTHER): Payer: Self-pay | Admitting: *Deleted

## 2020-04-07 DIAGNOSIS — Z1211 Encounter for screening for malignant neoplasm of colon: Secondary | ICD-10-CM

## 2020-04-26 ENCOUNTER — Encounter (INDEPENDENT_AMBULATORY_CARE_PROVIDER_SITE_OTHER): Payer: Self-pay | Admitting: *Deleted

## 2020-04-26 ENCOUNTER — Telehealth (INDEPENDENT_AMBULATORY_CARE_PROVIDER_SITE_OTHER): Payer: Self-pay | Admitting: *Deleted

## 2020-04-26 NOTE — Telephone Encounter (Signed)
Patient needs [employer] (copay card) ° °

## 2020-04-27 MED ORDER — SUTAB 1479-225-188 MG PO TABS: 1.0000 | ORAL_TABLET | Freq: Once | ORAL | 0 refills | Status: AC

## 2020-05-10 ENCOUNTER — Telehealth (INDEPENDENT_AMBULATORY_CARE_PROVIDER_SITE_OTHER): Payer: Self-pay | Admitting: *Deleted

## 2020-05-10 NOTE — Telephone Encounter (Signed)
Referring MD/PCP: hall   Procedure: tcs  Reason/Indication:  screening  Has patient had this procedure before?  no  If so, when, by whom and where?    Is there a family history of colon cancer?  no  Who?  What age when diagnosed?    Is patient diabetic?   no      Does patient have prosthetic heart valve or mechanical valve?  no  Do you have a pacemaker/defibrillator?  no  Has patient ever had endocarditis/atrial fibrillation? no  Does patient use oxygen? no  Has patient had joint replacement within last 12 months?  no  Is patient constipated or do they take laxatives? no  Does patient have a history of alcohol/drug use?  no  Is patient on blood thinner such as Coumadin, Plavix and/or Aspirin? no  Medications: hydrocodone prn for kidney stone  Allergies: nkda  Medication Adjustment per Dr Rehman/Dr Jenetta Downer   Procedure date & time: 06/09/20

## 2020-06-07 ENCOUNTER — Other Ambulatory Visit: Payer: Self-pay

## 2020-06-07 ENCOUNTER — Other Ambulatory Visit (HOSPITAL_COMMUNITY)
Admission: RE | Admit: 2020-06-07 | Discharge: 2020-06-07 | Disposition: A | Discharge status: Home / Self Care | Payer: PRIVATE HEALTH INSURANCE | Source: Ambulatory Visit | Attending: Internal Medicine | Admitting: Internal Medicine

## 2020-06-07 DIAGNOSIS — Z20822 Contact with and (suspected) exposure to covid-19: Secondary | ICD-10-CM | POA: Insufficient documentation

## 2020-06-07 DIAGNOSIS — Z01812 Encounter for preprocedural laboratory examination: Secondary | ICD-10-CM | POA: Insufficient documentation

## 2020-06-08 LAB — SARS CORONAVIRUS 2 (TAT 6-24 HRS): SARS Coronavirus 2: NEGATIVE

## 2020-06-09 ENCOUNTER — Encounter (HOSPITAL_COMMUNITY): Payer: Self-pay | Admitting: Internal Medicine

## 2020-06-09 ENCOUNTER — Other Ambulatory Visit: Payer: Self-pay

## 2020-06-09 ENCOUNTER — Encounter (HOSPITAL_COMMUNITY)
Admission: RE | Disposition: A | Discharge status: Home / Self Care | Payer: Self-pay | Source: Home / Self Care | Attending: Internal Medicine

## 2020-06-09 ENCOUNTER — Ambulatory Visit (HOSPITAL_COMMUNITY)
Admission: RE | Admit: 2020-06-09 | Discharge: 2020-06-09 | Disposition: A | Discharge status: Home / Self Care | Payer: PRIVATE HEALTH INSURANCE | Attending: Internal Medicine | Admitting: Internal Medicine

## 2020-06-09 DIAGNOSIS — Z9119 Patient's noncompliance with other medical treatment and regimen: Secondary | ICD-10-CM

## 2020-06-09 DIAGNOSIS — D123 Benign neoplasm of transverse colon: Secondary | ICD-10-CM

## 2020-06-09 DIAGNOSIS — Z1211 Encounter for screening for malignant neoplasm of colon: Secondary | ICD-10-CM

## 2020-06-09 DIAGNOSIS — Z87891 Personal history of nicotine dependence: Secondary | ICD-10-CM | POA: Diagnosis not present

## 2020-06-09 DIAGNOSIS — Z886 Allergy status to analgesic agent status: Secondary | ICD-10-CM | POA: Insufficient documentation

## 2020-06-09 HISTORY — PX: POLYPECTOMY: SHX5525

## 2020-06-09 HISTORY — PX: COLONOSCOPY: SHX5424

## 2020-06-09 LAB — HM COLONOSCOPY

## 2020-06-09 SURGERY — COLONOSCOPY
Anesthesia: Moderate Sedation

## 2020-06-09 MED ORDER — MEPERIDINE HCL 50 MG/ML IJ SOLN
INTRAMUSCULAR | Status: AC
  Filled 2020-06-09: qty 1

## 2020-06-09 MED ORDER — MIDAZOLAM HCL 5 MG/5ML IJ SOLN
INTRAMUSCULAR | Status: AC
  Filled 2020-06-09: qty 10

## 2020-06-09 MED ORDER — MIDAZOLAM HCL 5 MG/5ML IJ SOLN
INTRAMUSCULAR | Status: DC | PRN
  Administered 2020-06-09 (×2): 1 mg via INTRAVENOUS
  Administered 2020-06-09 (×2): 2 mg via INTRAVENOUS

## 2020-06-09 MED ORDER — SODIUM CHLORIDE 0.9 % IV SOLN: INTRAVENOUS | Status: DC

## 2020-06-09 MED ORDER — MEPERIDINE HCL 50 MG/ML IJ SOLN
INTRAMUSCULAR | Status: DC | PRN
Start: 2020-06-09 — End: 2020-06-09
  Administered 2020-06-09 (×2): 25 mg via INTRAVENOUS

## 2020-06-09 MED ORDER — STERILE WATER FOR IRRIGATION IR SOLN
Status: DC | PRN
  Administered 2020-06-09: 1.5 mL

## 2020-06-09 NOTE — H&P (Signed)
Matthew Bradford is an 59 y.o. male.   Chief Complaint: Patient is here for colonoscopy. HPI: Patient is 59 year old Caucasian male who is here for screening colonoscopy.  This is patient's first exam.  He denies change in his bowel habits abdominal pain or rectal bleeding.  He does not take any medications on a regular basis.  He has a history of kidney stones and if he has a problem he will take Flomax and pain medication but he has not had a spell in several months. Family history is negative for CRC.  Past Medical History:  Diagnosis Date  . Bladder stone   . Frequency of urination   . Hematuria   . History of cellulitis    03-2016  finger  . History of kidney stones   . Nephrolithiasis   . Urgency of urination   . Wears glasses     Past Surgical History:  Procedure Laterality Date  . CYSTOSCOPY WITH LITHOLAPAXY N/A 09/07/2016   Procedure: CYSTOSCOPY WITH LITHOLAPAXY;  Surgeon: Franchot Gallo, MD;  Location: [facility];  Service: Urology;  Laterality: N/A;  . CYSTOSCOPY/RETROGRADE/URETEROSCOPY/STONE EXTRACTION WITH BASKET  1996  . EXTRACORPOREAL SHOCK WAVE LITHOTRIPSY  11/29/2014  . EXTRACORPOREAL SHOCK WAVE LITHOTRIPSY Left 05/15/2018   Procedure: LEFT EXTRACORPOREAL SHOCK WAVE LITHOTRIPSY (ESWL);  Surgeon: Ardis Hughs, MD;  Location: [facility];  Service: Urology;  Laterality: Left;  . HOLMIUM LASER APPLICATION N/A [id number]   Procedure: HOLMIUM LASER APPLICATION;  Surgeon: Franchot Gallo, MD;  Location: [facility];  Service: Urology;  Laterality: N/A;  . I & D EXTREMITY Left 05/09/2016   Procedure: IRRIGATION AND DEBRIDEMENT LEFT INDEX FINGER;  Surgeon: Leandrew Koyanagi, MD;  Location: [facility];  Service: Orthopedics;  Laterality: Left;  . TONSILLECTOMY  1979    History reviewed. No pertinent family history. Social History:  reports that he quit smoking about 10 years ago. His smoking use included cigarettes. He quit after  30.00 years of use. His smokeless tobacco use includes chew. He reports that he does not drink alcohol and does not use drugs.  Allergies:  Allergies  Allergen Reactions  . Ibuprofen Other (See Comments)    "feels like gas build up in abdomen"    Medications Prior to Admission  Medication Sig Dispense Refill  . acetaminophen (TYLENOL) 500 MG tablet Take 1,000 mg by mouth every 4 (four) hours as needed for headache.    Nila Nephew [phone] MG TABS Take 12 tablets by mouth See admin instructions. Day One: Fill the provided container with 16 oz. of water.Take Swallow each tablet with a sip of water, and drink the entire amount of water over 15 to 20 minutes. Drink additional water as instructed. Day Two:  Fill the provided container with 16 oz. of water.Take Swallow each tablet with a sip of water, and drink the entire amount of water over 15 to 20 minutes. Drink additional water as instructed.      Results for orders placed or performed during the hospital encounter of 06/07/20 (from the past 48 hour(s))  SARS CORONAVIRUS 2 (TAT 6-24 HRS) Nasopharyngeal Nasopharyngeal Swab     Status: None   Collection Time: 06/07/20  3:50 PM   Specimen: Nasopharyngeal Swab  Result Value Ref Range   SARS Coronavirus 2 NEGATIVE NEGATIVE    Comment: (NOTE) SARS-CoV-2 target nucleic acids are NOT DETECTED.  The SARS-CoV-2 RNA is generally detectable in upper and lower respiratory specimens during the acute phase of  infection. Negative results do not preclude SARS-CoV-2 infection, do not rule out co-infections with other pathogens, and should not be used as the sole basis for treatment or other patient management decisions. Negative results must be combined with clinical observations, patient history, and epidemiological information. The expected result is Negative.  Fact Sheet for Patients: SugarRoll.be  Fact Sheet for Healthcare  Providers: https://www.woods-mathews.com/  This test is not yet approved or cleared by the [place] FDA and  has been authorized for detection and/or diagnosis of SARS-CoV-2 by FDA under an Emergency Use Authorization (EUA). This EUA will remain  in effect (meaning this test can be used) for the duration of the COVID-19 declaration under Se ction 564(b)(1) of the Act, 21 U.S.C. section 360bbb-3(b)(1), unless the authorization is terminated or revoked sooner.  Performed at [facility], [place] [address] [place], [place] [address]    No results found.  Review of Systems  Blood pressure (!) 143/86, pulse (!) 47, temperature 97.8 F (36.6 C), temperature source Oral, resp. rate 15, height 5\' 10"  (1.778 m), SpO2 96 %. Physical Exam HENT:     Mouth/Throat:     Mouth: Mucous membranes are moist.     Pharynx: Oropharynx is clear.  Eyes:     General: No scleral icterus.    Conjunctiva/sclera: Conjunctivae normal.  Cardiovascular:     Rate and Rhythm: Normal rate and regular rhythm.     Heart sounds: Normal heart sounds. No murmur heard.   Pulmonary:     Effort: Pulmonary effort is normal.     Breath sounds: Normal breath sounds.  Abdominal:     Comments: Abdomen is full.  Small umbilical hernia noted.  It is reducible. Abdomen is soft and nontender with no organomegaly or masses.  Musculoskeletal:        General: No swelling.     Cervical back: Neck supple.  Lymphadenopathy:     Cervical: No cervical adenopathy.  Skin:    General: Skin is warm and dry.  Neurological:     Mental Status: He is alert.      Assessment/Plan Average risk screening colonoscopy. This is patient's first exam.  Hildred Laser, MD 06/09/2020, 9:22 AM

## 2020-06-09 NOTE — Op Note (Signed)
[facility] Patient Name: Matthew Bradford Procedure Date: 06/09/2020 9:01 AM MRN: [id number] Date of Birth: [birth year] Attending MD: Hildred Laser , MD CSN: [id number] Age: 59 Admit Type: Outpatient Procedure:                Colonoscopy Indications:              Screening for colorectal malignant neoplasm Providers:                Hildred Laser, MD, Caprice Kluver, Casimer Bilis, Technician Referring MD:             Delphina Cahill, MD Medicines:                Meperidine 50 mg IV, Midazolam 6 mg IV Complications:            No immediate complications. Estimated Blood Loss:     Estimated blood loss was minimal. Procedure:                Pre-Anesthesia Assessment:                           - Prior to the procedure, a History and Physical                            was performed, and patient medications and                            allergies were reviewed. The patient's tolerance of                            previous anesthesia was also reviewed. The risks                            and benefits of the procedure and the sedation                            options and risks were discussed with the patient.                            All questions were answered, and informed consent                            was obtained. Prior Anticoagulants: The patient has                            taken no previous anticoagulant or antiplatelet                            agents. ASA Grade Assessment: I - A normal, healthy                            patient. After reviewing the risks and benefits,  the patient was deemed in satisfactory condition to                            undergo the procedure.                           After obtaining informed consent, the colonoscope                            was passed under direct vision. Throughout the                            procedure, the patient's blood pressure, pulse, and                             oxygen saturations were monitored continuously. The                            PCF-H190DL ([id number]) scope was introduced through                            the anus and advanced to the the cecum, identified                            by appendiceal orifice and ileocecal valve. The                            colonoscopy was performed without difficulty. The                            patient tolerated the procedure well. The quality                            of the bowel preparation was inadequate. The                            ileocecal valve and the rectum were photographed. Scope In: 9:30:25 AM Scope Out: 9:47:54 AM Scope Withdrawal Time: 0 hours 2 minutes 57 seconds  Total Procedure Duration: 0 hours 17 minutes 29 seconds  Findings:      The perianal and digital rectal examinations were normal.      A small polyp was found in the hepatic flexure. Biopsies were taken with       a cold forceps for histology.      The retroflexed view of the distal rectum and anal verge was normal and       showed no anal or rectal abnormalities. Impression:               - Preparation of the colon was inadequate.                           - One small polyp at the hepatic flexure. Biopsied. Moderate Sedation:      Moderate (conscious) sedation was administered by the endoscopy nurse       and supervised by the endoscopist. The following parameters were  monitored: oxygen saturation, heart rate, blood pressure, CO2       capnography and response to care. Total physician intraservice time was       26 minutes. Recommendation:           - Patient has a contact number available for                            emergencies. The signs and symptoms of potential                            delayed complications were discussed with the                            patient. Return to normal activities tomorrow.                            Written discharge instructions were provided to the                             patient.                           - Resume previous diet today.                           - Continue present medications.                           - Patient has a contact number available for                            emergencies. The signs and symptoms of potential                            delayed complications were discussed with the                            patient. Return to normal activities tomorrow.                            Written discharge instructions were provided to the                            patient.                           - No aspirin, ibuprofen, naproxen, or other                            non-steroidal anti-inflammatory drugs for 1 day.                           - Await pathology results.                           - Repeat colonoscopy to be determined.. Procedure  Code(s):        --- Professional ---                           [phone], Colonoscopy, flexible; with biopsy, single                            or multiple                           99153, Moderate sedation; each additional 15                            minutes intraservice time                           G0500, Moderate sedation services provided by the                            same physician or other qualified health care                            professional performing a gastrointestinal                            endoscopic service that sedation supports,                            requiring the presence of an independent trained                            observer to assist in the monitoring of the                            patient's level of consciousness and physiological                            status; initial 15 minutes of intra-service time;                            patient age 72 years or older (additional time may                            be reported with [id number], as appropriate) Diagnosis Code(s):        --- Professional ---                           Z12.11, Encounter  for screening for malignant                            neoplasm of colon                           K63.5, Polyp of colon CPT copyright 2019 American Medical Association. All rights reserved. The codes documented in this report are preliminary and upon coder review may  be revised to  meet current compliance requirements. Hildred Laser, MD Hildred Laser, MD 06/09/2020 10:05:02 AM This report has been signed electronically. Number of Addenda: 0

## 2020-06-09 NOTE — Discharge Instructions (Signed)
Colonoscopy, Adult, Care After This sheet gives you information about how to care for yourself after your procedure. Your doctor may also give you more specific instructions. If you have problems or questions, call your doctor. What can I expect after the procedure? After the procedure, it is common to have:  A small amount of blood in your poop (stool) for 24 hours.  Some gas.  Mild cramping or bloating in your belly (abdomen). Follow these instructions at home: Eating and drinking   Drink enough fluid to keep your pee (urine) pale yellow.  Follow instructions from your doctor about what you cannot eat or drink.  Return to your normal diet as told by your doctor. Avoid heavy or fried foods that are hard to digest. Activity  Rest as told by your doctor.  Do not sit for a long time without moving. Get up to take short walks every 1-2 hours. This is important. Ask for help if you feel weak or unsteady.  Return to your normal activities as told by your doctor. Ask your doctor what activities are safe for you. To help cramping and bloating:   Try walking around.  Put heat on your belly as told by your doctor. Use the heat source that your doctor recommends, such as a moist heat pack or a heating pad. ? Put a towel between your skin and the heat source. ? Leave the heat on for 20-30 minutes. ? Remove the heat if your skin turns bright red. This is very important if you are unable to feel pain, heat, or cold. You may have a greater risk of getting burned. General instructions  For the first 24 hours after the procedure: ? Do not drive or use machinery. ? Do not sign important documents. ? Do not drink alcohol. ? Do your daily activities more slowly than normal. ? Eat foods that are soft and easy to digest.  Take over-the-counter or prescription medicines only as told by your doctor.  Keep all follow-up visits as told by your doctor. This is important. Contact a doctor  if:  You have blood in your poop 2-3 days after the procedure. Get help right away if:  You have more than a small amount of blood in your poop.  You see large clumps of tissue (blood clots) in your poop.  Your belly is swollen.  You feel like you may vomit (nauseous).  You vomit.  You have a fever.  You have belly pain that gets worse, and medicine does not help your pain. Summary  After the procedure, it is common to have a small amount of blood in your poop. You may also have mild cramping and bloating in your belly.  For the first 24 hours after the procedure, do not drive or use machinery, do not sign important documents, and do not drink alcohol.  Get help right away if you have a lot of blood in your poop, feel like you may vomit, have a fever, or have more belly pain. This information is not intended to replace advice given to you by your health care provider. Make sure you discuss any questions you have with your health care provider. Document Revised: 04/06/2019 Document Reviewed: 04/06/2019 Elsevier Patient Education  [place] usual medications.  Please do not exceed dose to more than 3 g and any given day. Resume usual diet. No driving for 24 hours. Physician will call with biopsy results.   Colon Polyps  Polyps  are tissue growths inside the body. Polyps can grow in many places, including the large intestine (colon). A polyp may be a round bump or a mushroom-shaped growth. You could have one polyp or several. Most colon polyps are noncancerous (benign). However, some colon polyps can become cancerous over time. Finding and removing the polyps early can help prevent this. What are the causes? The exact cause of colon polyps is not known. What increases the risk? You are more likely to develop this condition if you:  Have a family history of colon cancer or colon polyps.  Are older than 48 or older than 45 if you are African  American.  Have inflammatory bowel disease, such as ulcerative colitis or Crohn's disease.  Have certain hereditary conditions, such as: ? Familial adenomatous polyposis. ? Lynch syndrome. ? Turcot syndrome. ? Peutz-Jeghers syndrome.  Are overweight.  Smoke cigarettes.  Do not get enough exercise.  Drink too much alcohol.  Eat a diet that is high in fat and red meat and low in fiber.  Had childhood cancer that was treated with abdominal radiation. What are the signs or symptoms? Most polyps do not cause symptoms. If you have symptoms, they may include:  Blood coming from your rectum when having a bowel movement.  Blood in your stool. The stool may look dark red or black.  Abdominal pain.  A change in bowel habits, such as constipation or diarrhea. How is this diagnosed? This condition is diagnosed with a colonoscopy. This is a procedure in which a lighted, flexible scope is inserted into the anus and then passed into the colon to examine the area. Polyps are sometimes found when a colonoscopy is done as part of routine cancer screening tests. How is this treated? Treatment for this condition involves removing any polyps that are found. Most polyps can be removed during a colonoscopy. Those polyps will then be tested for cancer. Additional treatment may be needed depending on the results of testing. Follow these instructions at home: Lifestyle  Maintain a healthy weight, or lose weight if recommended by your health care provider.  Exercise every day or as told by your health care provider.  Do not use any products that contain nicotine or tobacco, such as cigarettes and e-cigarettes. If you need help quitting, ask your health care provider.  If you drink alcohol, limit how much you have: ? 0-1 drink a day for women. ? 0-2 drinks a day for men.  Be aware of how much alcohol is in your drink. In the [place] one drink equals one 12 oz bottle of beer (355 mL), one 5 oz glass  of wine (148 mL), or one 1 oz shot of hard liquor (44 mL). Eating and drinking   Eat foods that are high in fiber, such as fruits, vegetables, and whole grains.  Eat foods that are high in calcium and vitamin D, such as milk, cheese, yogurt, eggs, liver, fish, and broccoli.  Limit foods that are high in fat, such as fried foods and desserts.  Limit the amount of red meat and processed meat you eat, such as hot dogs, sausage, bacon, and lunch meats. General instructions  Keep all follow-up visits as told by your health care provider. This is important. ? This includes having regularly scheduled colonoscopies. ? Talk to your health care provider about when you need a colonoscopy. Contact a health care provider if:  You have new or worsening bleeding during a bowel movement.  You have new or  increased blood in your stool.  You have a change in bowel habits.  You lose weight for no known reason. Summary  Polyps are tissue growths inside the body. Polyps can grow in many places, including the colon.  Most colon polyps are noncancerous (benign), but some can become cancerous over time.  This condition is diagnosed with a colonoscopy.  Treatment for this condition involves removing any polyps that are found. Most polyps can be removed during a colonoscopy. This information is not intended to replace advice given to you by your health care provider. Make sure you discuss any questions you have with your health care provider. Document Revised: 12/26/2017 Document Reviewed: 12/26/2017 Elsevier Patient Education  [place].

## 2020-06-10 LAB — SURGICAL PATHOLOGY

## 2020-06-14 ENCOUNTER — Encounter (HOSPITAL_COMMUNITY): Payer: Self-pay | Admitting: Internal Medicine

## 2020-07-01 ENCOUNTER — Encounter (INDEPENDENT_AMBULATORY_CARE_PROVIDER_SITE_OTHER): Payer: Self-pay | Admitting: *Deleted

## 2020-12-16 ENCOUNTER — Other Ambulatory Visit: Payer: Self-pay | Admitting: Urology

## 2020-12-16 ENCOUNTER — Ambulatory Visit (HOSPITAL_COMMUNITY)
Admission: RE | Admit: 2020-12-16 | Discharge: 2020-12-16 | Disposition: A | Discharge status: Home / Self Care | Payer: PRIVATE HEALTH INSURANCE | Source: Ambulatory Visit | Attending: Urology | Admitting: Urology

## 2020-12-16 ENCOUNTER — Ambulatory Visit (HOSPITAL_COMMUNITY): Payer: PRIVATE HEALTH INSURANCE | Admitting: Anesthesiology

## 2020-12-16 ENCOUNTER — Encounter (HOSPITAL_COMMUNITY)
Admission: RE | Disposition: A | Discharge status: Home / Self Care | Payer: Self-pay | Source: Ambulatory Visit | Attending: Urology

## 2020-12-16 ENCOUNTER — Ambulatory Visit (HOSPITAL_COMMUNITY): Payer: PRIVATE HEALTH INSURANCE

## 2020-12-16 ENCOUNTER — Encounter (HOSPITAL_COMMUNITY): Payer: Self-pay | Admitting: Urology

## 2020-12-16 DIAGNOSIS — Z87442 Personal history of urinary calculi: Secondary | ICD-10-CM | POA: Diagnosis not present

## 2020-12-16 DIAGNOSIS — Z79899 Other long term (current) drug therapy: Secondary | ICD-10-CM | POA: Insufficient documentation

## 2020-12-16 DIAGNOSIS — Z87891 Personal history of nicotine dependence: Secondary | ICD-10-CM | POA: Diagnosis not present

## 2020-12-16 DIAGNOSIS — N400 Enlarged prostate without lower urinary tract symptoms: Secondary | ICD-10-CM | POA: Diagnosis not present

## 2020-12-16 DIAGNOSIS — U071 COVID-19: Secondary | ICD-10-CM | POA: Diagnosis not present

## 2020-12-16 DIAGNOSIS — N132 Hydronephrosis with renal and ureteral calculous obstruction: Secondary | ICD-10-CM | POA: Diagnosis not present

## 2020-12-16 HISTORY — PX: CYSTOSCOPY WITH RETROGRADE PYELOGRAM, URETEROSCOPY AND STENT PLACEMENT: SHX5789

## 2020-12-16 HISTORY — PX: HOLMIUM LASER APPLICATION: SHX5852

## 2020-12-16 LAB — CBC
HCT: 40.5 % (ref 39.0–52.0)
Hemoglobin: 14.7 g/dL (ref 13.0–17.0)
MCH: 33.2 pg (ref 26.0–34.0)
MCHC: 36.3 g/dL — ABNORMAL HIGH (ref 30.0–36.0)
MCV: 91.4 fL (ref 80.0–100.0)
Platelets: 167 10*3/uL (ref 150–400)
RBC: 4.43 MIL/uL (ref 4.22–5.81)
RDW: 12 % (ref 11.5–15.5)
WBC: 6.3 10*3/uL (ref 4.0–10.5)
nRBC: 0 % (ref 0.0–0.2)

## 2020-12-16 LAB — BASIC METABOLIC PANEL
Anion gap: 7 (ref 5–15)
BUN: 17 mg/dL (ref 6–20)
CO2: 22 mmol/L (ref 22–32)
Calcium: 9.1 mg/dL (ref 8.9–10.3)
Chloride: 109 mmol/L (ref 98–111)
Creatinine, Ser: 1.37 mg/dL — ABNORMAL HIGH (ref 0.61–1.24)
GFR, Estimated: 59 mL/min — ABNORMAL LOW (ref >60)
Glucose, Bld: 94 mg/dL (ref 70–99)
Potassium: 3.9 mmol/L (ref 3.5–5.1)
Sodium: 138 mmol/L (ref 135–145)

## 2020-12-16 LAB — RESP PANEL BY RT-PCR (FLU A&B, COVID) ARPGX2
Influenza A by PCR: NEGATIVE
Influenza B by PCR: NEGATIVE
SARS Coronavirus 2 by RT PCR: POSITIVE — AB

## 2020-12-16 SURGERY — CYSTOURETEROSCOPY, WITH RETROGRADE PYELOGRAM AND STENT INSERTION
Anesthesia: General | Laterality: Left

## 2020-12-16 MED ORDER — ACETAMINOPHEN 500 MG PO TABS
1000.0000 mg | ORAL_TABLET | Freq: Once | ORAL | Status: AC
  Administered 2020-12-16: 1000 mg via ORAL
  Filled 2020-12-16: qty 2

## 2020-12-16 MED ORDER — ONDANSETRON HCL 4 MG/2ML IJ SOLN
INTRAMUSCULAR | Status: DC | PRN
  Administered 2020-12-16: 4 mg via INTRAVENOUS

## 2020-12-16 MED ORDER — KETOROLAC TROMETHAMINE 30 MG/ML IJ SOLN
INTRAMUSCULAR | Status: DC | PRN
  Administered 2020-12-16: 30 mg via INTRAVENOUS

## 2020-12-16 MED ORDER — LIDOCAINE 2% (20 MG/ML) 5 ML SYRINGE
INTRAMUSCULAR | Status: DC | PRN
  Administered 2020-12-16: 60 mg via INTRAVENOUS

## 2020-12-16 MED ORDER — OXYCODONE HCL 5 MG/5ML PO SOLN
5.0000 mg | Freq: Once | ORAL | Status: DC | PRN
Start: 2020-12-16 — End: 2020-12-17

## 2020-12-16 MED ORDER — PHENYLEPHRINE 40 MCG/ML (10ML) SYRINGE FOR IV PUSH (FOR BLOOD PRESSURE SUPPORT)
PREFILLED_SYRINGE | INTRAVENOUS | Status: DC | PRN
  Administered 2020-12-16: 80 ug via INTRAVENOUS
  Administered 2020-12-16 (×2): 120 ug via INTRAVENOUS

## 2020-12-16 MED ORDER — CEFAZOLIN SODIUM-DEXTROSE 2-4 GM/100ML-% IV SOLN
2.0000 g | INTRAVENOUS | Status: AC
  Administered 2020-12-16: 2 g via INTRAVENOUS
  Filled 2020-12-16: qty 100

## 2020-12-16 MED ORDER — IOHEXOL 300 MG/ML  SOLN
INTRAMUSCULAR | Status: DC | PRN
  Administered 2020-12-16: 10 mL

## 2020-12-16 MED ORDER — FENTANYL CITRATE (PF) 100 MCG/2ML IJ SOLN
INTRAMUSCULAR | Status: AC
  Filled 2020-12-16: qty 2

## 2020-12-16 MED ORDER — HYDROMORPHONE HCL 1 MG/ML IJ SOLN: 0.2500 mg | INTRAMUSCULAR | Status: DC | PRN

## 2020-12-16 MED ORDER — OXYCODONE HCL 5 MG PO TABS
5.0000 mg | ORAL_TABLET | Freq: Once | ORAL | Status: DC | PRN
Start: 2020-12-16 — End: 2020-12-17

## 2020-12-16 MED ORDER — LACTATED RINGERS IV SOLN: INTRAVENOUS | Status: DC

## 2020-12-16 MED ORDER — DEXAMETHASONE SODIUM PHOSPHATE 10 MG/ML IJ SOLN
INTRAMUSCULAR | Status: DC | PRN
  Administered 2020-12-16: 4 mg via INTRAVENOUS

## 2020-12-16 MED ORDER — CIPROFLOXACIN HCL 500 MG PO TABS: 500.0000 mg | ORAL_TABLET | Freq: Once | ORAL | 0 refills | Status: AC

## 2020-12-16 MED ORDER — ONDANSETRON HCL 4 MG/2ML IJ SOLN: 4.0000 mg | Freq: Once | INTRAMUSCULAR | Status: DC | PRN

## 2020-12-16 MED ORDER — SODIUM CHLORIDE 0.9 % IR SOLN
Status: DC | PRN
  Administered 2020-12-16: 6000 mL

## 2020-12-16 MED ORDER — PROPOFOL 10 MG/ML IV BOLUS
INTRAVENOUS | Status: DC | PRN
  Administered 2020-12-16 (×2): 50 mg via INTRAVENOUS
  Administered 2020-12-16: 200 mg via INTRAVENOUS

## 2020-12-16 MED ORDER — FENTANYL CITRATE (PF) 100 MCG/2ML IJ SOLN
50.0000 ug | INTRAMUSCULAR | Status: DC | PRN
  Administered 2020-12-16: 50 ug via INTRAVENOUS

## 2020-12-16 SURGICAL SUPPLY — 20 items
BAG DRAIN URO-CYSTO SKYTR STRL (DRAIN) ×2 IMPLANT
BAG DRN UROCATH (DRAIN) ×1
BASKET ZERO TIP NITINOL 2.4FR (BASKET) ×1 IMPLANT
BSKT STON RTRVL ZERO TP 2.4FR (BASKET) ×1
CATH URET 5FR 28IN OPEN ENDED (CATHETERS) ×2 IMPLANT
CLOTH BEACON ORANGE TIMEOUT ST (SAFETY) ×2 IMPLANT
DRSG TEGADERM 4X4.75 (GAUZE/BANDAGES/DRESSINGS) IMPLANT
EXTRACTOR STONE 1.7FRX115CM (UROLOGICAL SUPPLIES) IMPLANT
GLOVE SURG ENC MOIS LTX SZ6.5 (GLOVE) ×2 IMPLANT
GOWN STRL REUS W/TWL LRG LVL3 (GOWN DISPOSABLE) ×2 IMPLANT
GUIDEWIRE STR DUAL SENSOR (WIRE) ×2 IMPLANT
IV NS IRRIG 3000ML ARTHROMATIC (IV SOLUTION) ×2 IMPLANT
KIT TURNOVER CYSTO (KITS) ×2 IMPLANT
MANIFOLD NEPTUNE II (INSTRUMENTS) ×2 IMPLANT
PACK CYSTO (CUSTOM PROCEDURE TRAY) ×2 IMPLANT
STENT URET 6FRX26 CONTOUR (STENTS) ×1 IMPLANT
TRACTIP FLEXIVA PULS ID 200XHI (Laser) IMPLANT
TRACTIP FLEXIVA PULSE ID 200 (Laser) ×4
TUBE CONNECTING 12X1/4 (SUCTIONS) ×2 IMPLANT
TUBING UROLOGY SET (TUBING) ×2 IMPLANT

## 2020-12-16 NOTE — Anesthesia Procedure Notes (Signed)
Procedure Name: LMA Insertion Performed by: Milford Cage, CRNA Pre-anesthesia Checklist: Patient identified, Emergency Drugs available, Suction available and Patient being monitored Patient Re-evaluated:Patient Re-evaluated prior to induction Oxygen Delivery Method: Circle System Utilized Preoxygenation: Pre-oxygenation with 100% oxygen Induction Type: IV induction LMA: LMA inserted LMA Size: 4.0 Number of attempts: 1 Airway Equipment and Method: Bite block Placement Confirmation: positive ETCO2 Tube secured with: Tape Dental Injury: Teeth and Oropharynx as per pre-operative assessment

## 2020-12-16 NOTE — Anesthesia Postprocedure Evaluation (Signed)
Anesthesia Post Note  Patient: Matthew Bradford  Procedure(s) Performed: CYSTOSCOPY WITH RETROGRADE PYELOGRAM, URETEROSCOPY AND STENT PLACEMENT (Left ) HOLMIUM LASER APPLICATION (Left )     Patient location during evaluation: PACU Anesthesia Type: General Level of consciousness: awake and alert, oriented and patient cooperative Pain management: pain level controlled Vital Signs Assessment: post-procedure vital signs reviewed and stable Respiratory status: spontaneous breathing, nonlabored ventilation and respiratory function stable Cardiovascular status: blood pressure returned to baseline and stable Postop Assessment: no apparent nausea or vomiting Anesthetic complications: no   No complications documented.  Last Vitals:  Vitals:   12/16/20 1815 12/16/20 1830  BP: (!) 142/91 (!) 157/83  Pulse: (!) 49 (!) 56  Resp: 12 16  Temp:  (!) 36.4 C  SpO2: 100% 100%    Last Pain:  Vitals:   12/16/20 1830  TempSrc:   PainSc: [place]

## 2020-12-16 NOTE — Op Note (Signed)
Preoperative diagnosis: left ureteral calculus  Postoperative diagnosis: left ureteral calculus  Procedure:  1. Cystoscopy 2. left ureteroscopy, laser lithotripsy, and basket stone extraction 3. left 61F x 26cm ureteral stent placement - tether 4. left retrograde pyelography with interpretation  Surgeon: Jacalyn Lefevre, MD  Anesthesia: General  Complications: None  Intraoperative findings: left retrograde pyelography demonstrated a filling defect within the left ureter consistent with the patient's known calculus and hydronephrosis to distal ureter  EBL: Minimal  Specimens: 1. left ureteral calculus  Disposition of specimens: [facility] for stone analysis  Indication: Matthew Bradford is a 60 y.o.   patient with a 67mm x 6 mm left mid ureteral calculi  and associated left symptoms. After reviewing the management options for treatment, the patient elected to proceed with the above surgical procedure(s). We have discussed the potential benefits and risks of the procedure, side effects of the proposed treatment, the likelihood of the patient achieving the goals of the procedure, and any potential problems that might occur during the procedure or recuperation. Informed consent has been obtained.   Description of procedure:  The patient was taken to the operating room and general anesthesia was induced.  The patient was placed in the dorsal lithotomy position, prepped and draped in the usual sterile fashion, and preoperative antibiotics were administered. A preoperative time-out was performed.   Cystourethroscopy was performed.  The patient's urethra was examined and was normal demonstrated bilobar prostatic hypertrophy. / bilobar prostatic hypertrophy with a median lobe. The bladder was then systematically examined in its entirety. There was no evidence for any bladder tumors, stones, or other mucosal pathology.    Attention then turned to the left ureteral orifice and  a ureteral catheter was used to intubate the ureteral orifice.  Omnipaque contrast was injected through the ureteral catheter and a retrograde pyelogram was performed with findings as dictated above.  A 0.38 sensor guidewire was then advanced up the left ureter into the renal pelvis under fluoroscopic guidance. The 6 Fr semirigid ureteroscope was then advanced into the ureter next to the guidewire and the calculus was identified.   The stone was then fragmented with the 365 micron holmium laser fiber on a setting of 0.5 and frequency of 20 Hz.   All stones were then removed from the ureter with an o-tip nitinol basket.  Reinspection of the ureter revealed no remaining visible stones or fragments.   The wire was then backloaded through the cystoscope and a ureteral stent was advance over the wire using Seldinger technique.  The stent was positioned appropriately under fluoroscopic and cystoscopic guidance.  The wire was then removed with an adequate stent curl noted in the renal pelvis as well as in the bladder.  The bladder was then emptied and the procedure ended.  The patient appeared to tolerate the procedure well and without complications.  The patient was able to be awakened and transferred to the recovery unit in satisfactory condition.   Disposition: The tether of the stent was left on and secured to the ventral aspect of the patient's penis.  Instructions for removing the stent have been provided to the patient.

## 2020-12-16 NOTE — Anesthesia Preprocedure Evaluation (Addendum)
Anesthesia Evaluation  Patient identified by MRN, date of birth, ID band Patient awake    Reviewed: Allergy & Precautions, NPO status , Patient's Chart, lab work & pertinent test results  Airway Mallampati: II  TM Distance: >3 FB Neck ROM: Full    Dental no notable dental hx. (+) Dental Advisory Given   Pulmonary former smoker,  Quit smoking 2011 COVID + 12/16/20   Pulmonary exam normal breath sounds clear to auscultation       Cardiovascular negative cardio ROS Normal cardiovascular exam Rhythm:Regular Rate:Normal     Neuro/Psych negative neurological ROS  negative psych ROS   GI/Hepatic negative GI ROS, Neg liver ROS,   Endo/Other  negative endocrine ROS  Renal/GU Renal disease (L ureteral calculus)  negative genitourinary   Musculoskeletal negative musculoskeletal ROS (+)   Abdominal   Peds  Hematology  (+) Blood dyscrasia, anemia ,   Anesthesia Other Findings   Reproductive/Obstetrics negative OB ROS                            Anesthesia Physical Anesthesia Plan  ASA: II  Anesthesia Plan: General   Post-op Pain Management:    Induction: Intravenous and Rapid sequence  PONV Risk Score and Plan: 3 and Ondansetron, Dexamethasone, Midazolam and Treatment may vary due to age or medical condition  Airway Management Planned: Oral ETT  Additional Equipment: None  Intra-op Plan:   Post-operative Plan: Extubation in OR  Informed Consent: I have reviewed the patients History and Physical, chart, labs and discussed the procedure including the risks, benefits and alternatives for the proposed anesthesia with the patient or authorized representative who has indicated his/her understanding and acceptance.     Dental advisory given  Plan Discussed with: CRNA  Anesthesia Plan Comments:        Anesthesia Quick Evaluation

## 2020-12-16 NOTE — H&P (Signed)
CC/HPI: cc: Left ureteral calculi   12/12/2020: 60 year old man with a history of urolithiasis last seen at [facility] in 12-17-17 at which time he underwent ESWL. He comes in as an urgent work-in on-call writhing in pain from his left flank. He denies any nausea and vomiting. He is really unable to answer any questions secondary to pain. He feels like this is it a kidney stone. He denies fever and chills. CT scan of the abdomen pelvis show 6 mm in width by 17 mm in length mid left ureteral cluster of stones with moderate hydronephrosis proximally. Please no patient was extremely angry that he was unable to see Dr. Diona Fanti and that lithotripsy was so expensive.    12/16/2020: 60 year old man who presented earlier this week with left flank pain found to have 6 mm x 17 mm cluster of ureteral stones on the left mid ureter with hydronephrosis. Patient returns today after attempting medical expulsive therapy with intolerable pain. He does not think that he can make it through the weekend. He denies any fevers, chills, nausea or vomiting. Again he is very upset about having to pay for surgery.     ALLERGIES: No Allergies    MEDICATIONS: Ketorolac Tromethamine 10 mg tablet 1 tablet PO BID kidney stone pain  Tamsulosin Hcl 0.4 mg capsule  Hydrocodone-Acetaminophen 5 mg-325 mg tablet 2 tablet PO Q 6 H PRN  Hydrocodone-Acetaminophen 5 mg-325 mg tablet 1-2 tablet PO Q 6 H PRN severe kidney stone pain     GU PSH: Cysto Bladder Stone <2.5cm - 12-18-2015 Cystoscopy - 18-Dec-2015 ESWL - Dec 17, 2017, 2014/12/18, December 17, 2008       [place] Notes: Hand Surgery-left finger 04/2016   NON-GU PSH: Remove Tonsils - 12/17/2008     GU PMH: Renal calculus (Stable) - 12/12/2020, Dec 18, 2015, 12/18/2015, 18-Dec-2015, Left nephrolithiasis, - 18-Dec-2014 Ureteral calculus (Stable) - 12/12/2020, It looks as if he has passed the ureteral stone., 2015/12/18, Left, It appears he has passed stone. Will send stone fragments for analysis , - 12/18/2015, Ureteral calculus, left, - 2014/12/18 Ureteral  obstruction secondary to calculous - 12/12/2020 Gross hematuria - 12/17/2017 History of urolithiasis - 12-17-17, Nephrolithiasis, - 12-17-2012 Left testicular pain, -Likely radiating pain from his left UPJ stone - 1610 Renal colic, Left - 9604 Bladder Stone, He has a 15 mm bladder stone that is symptomatic. 12-18-2015 Microscopic hematuria, Secondary to distal left ureteral calculus. - December 18, 2015 Dorsalgia, Unspec 2015/12/18 Encounter for Prostate Cancer screening - December 18, 2015 Other dorsalgia - Dec 18, 2015 LLQ pain, Abdominal Pain In The Left Lower Belly (LLQ) - 12/17/12 Other microscopic hematuria, Microscopic Hematuria - 12-17-2012 RLQ pain, Abdominal Pain In The Right Lower Belly (RLQ) - 12-17-12    NON-GU PMH: Encounter for general adult medical examination without abnormal findings, Encounter for preventive health examination - 2014/12/18 Hypercalciuria, Hypercalciuria - 2012-12-17    FAMILY HISTORY: Death - Father, Mother Family Health Status - Father alive at age 42 - 51 In Family Family Health Status - Mother's Age - Runs In Family Family Health Status Number - Runs In Family nephrolithiasis - Father, Uncle, Grandfather   SOCIAL HISTORY: Marital Status: Married Preferred Language: English; Race: White Current Smoking Status: Patient does not smoke anymore. Has not smoked since 02/22/2010.  Has never drank.  Drinks 3 caffeinated drinks per day. Patient's occupation Landscape architect.[employer]    REVIEW OF SYSTEMS:    GU Review Male:   Patient denies frequent urination, hard to postpone urination, burning/ pain with urination, get  up at night to urinate, leakage of urine, stream starts and stops, trouble starting your stream, have to strain to urinate , erection problems, and penile pain.  Gastrointestinal (Upper):   Patient denies nausea, vomiting, and indigestion/ heartburn.  Gastrointestinal (Lower):   Patient denies diarrhea and constipation.  Constitutional:   Patient denies fever, night sweats, weight loss, and fatigue.  Skin:   Patient  denies skin rash/ lesion and itching.  Eyes:   Patient denies blurred vision and double vision.  Ears/ Nose/ Throat:   Patient denies sore throat and sinus problems.  Hematologic/Lymphatic:   Patient denies swollen glands and easy bruising.  Cardiovascular:   Patient denies leg swelling and chest pains.  Respiratory:   Patient denies shortness of breath and cough.  Endocrine:   Patient denies excessive thirst.  Musculoskeletal:   Patient denies back pain and joint pain.  Neurological:   Patient denies headaches and dizziness.  Psychologic:   Patient denies depression and anxiety.   VITAL SIGNS:      12/16/2020 08:22 AM  Weight 180 lb / 81.65 kg  Height 70 in / 177.8 cm  BP 165/88 mmHg  Pulse 61 /min  Temperature 98.0 F / 36.6 C  BMI 25.8 kg/m   MULTI-SYSTEM PHYSICAL EXAMINATION:    Constitutional: Well-nourished. No physical deformities. Normally developed. Good grooming.  Neck: Neck symmetrical, not swollen. Normal tracheal position.  Respiratory: No labored breathing, no use of accessory muscles.   Skin: No paleness, no jaundice, no cyanosis. No lesion, no ulcer, no rash.  Neurologic / Psychiatric: Oriented to time, oriented to place, oriented to person. No depression, no anxiety, no agitation.  Gastrointestinal: No rigidity, non obese abdomen. Patient patient holding left flank during exam.  Eyes: Normal conjunctivae. Normal eyelids.  Ears, Nose, Mouth, and Throat: Left ear no scars, no lesions, no masses. Right ear no scars, no lesions, no masses. Nose no scars, no lesions, no masses. Normal hearing. Normal lips.  Musculoskeletal: Normal gait and station of head and neck.     Complexity of Data:  Records Review:   Previous Patient Records, POC Tool  Urine Test Review:   Urinalysis  X-Ray Review: C.T. Abdomen/Pelvis: Reviewed Films. Reviewed Report. Discussed With Patient.     03/30/16  PSA  Total PSA 1.00    Notes:                     02/17/2020: BUN 14, creatinine 1.68    PROCEDURES: None   ASSESSMENT:      ICD-10 Details  1 GU:   Ureteral calculus - B15.1 Acute, Uncomplicated  2   Ureteral obstruction secondary to calculous - V61.6 Acute, Uncomplicated   PLAN:           Document Letter(s):  Created for Patient: Clinical Summary         Notes:   I reviewed with patient management options of left ureteral calculi including medical expulsive therapy, ESWL and ureteroscopy with laser lithotripsy and stent placement. I discussed with the patient that there is a possibility that I am unable to get to the stones and that he this would be a staged procedure with the stent placement 1st. Other risks discussed with the patient included but are not limited to bleeding, infection, stent pain, failure to remove stones, need for staged or additional procedures, damage to surrounding structures. Patient wishes to proceed with surgery today. Will move forward with cystoscopy with left ureteroscopy laser lithotripsy and stent placement.

## 2020-12-16 NOTE — Interval H&P Note (Signed)
History and Physical Interval Note:  12/16/2020 2:33 PM  Matthew Bradford  has presented today for surgery, with the diagnosis of LEFT URETERAL CALCULUS.  The various methods of treatment have been discussed with the patient and family. After consideration of risks, benefits and other options for treatment, the patient has consented to  Procedure(s) with comments: CYSTOSCOPY WITH RETROGRADE PYELOGRAM, URETEROSCOPY AND STENT PLACEMENT (Left) - 96 MINS HOLMIUM LASER APPLICATION (Left) as a surgical intervention.  The patient's history has been reviewed, patient examined, no change in status, stable for surgery.  I have reviewed the patient's chart and labs.  Questions were answered to the patient's satisfaction.     Jessee Newnam D Barabara Motz

## 2020-12-16 NOTE — Discharge Instructions (Signed)
General Anesthesia, Adult, Care After This sheet gives you information about how to care for yourself after your procedure. Your health care provider may also give you more specific instructions. If you have problems or questions, contact your health care provider. What can I expect after the procedure? After the procedure, the following side effects are common:  Pain or discomfort at the IV site.  Nausea.  Vomiting.  Sore throat.  Trouble concentrating.  Feeling cold or chills.  Feeling weak or tired.  Sleepiness and fatigue.  Soreness and body aches. These side effects can affect parts of the body that were not involved in surgery. Follow these instructions at home: For the time period you were told by your health care provider:  Rest.  Do not participate in activities where you could fall or become injured.  Do not drive or use machinery.  Do not drink alcohol.  Do not take sleeping pills or medicines that cause drowsiness.  Do not make important decisions or sign legal documents.  Do not take care of children on your own.   Eating and drinking  Follow any instructions from your health care provider about eating or drinking restrictions.  When you feel hungry, start by eating small amounts of foods that are soft and easy to digest (bland), such as toast. Gradually return to your regular diet.  Drink enough fluid to keep your urine pale yellow.  If you vomit, rehydrate by drinking water, juice, or clear broth. General instructions  If you have sleep apnea, surgery and certain medicines can increase your risk for breathing problems. Follow instructions from your health care provider about wearing your sleep device: ? Anytime you are sleeping, including during daytime naps. ? While taking prescription pain medicines, sleeping medicines, or medicines that make you drowsy.  Have a responsible adult stay with you for the time you are told. It is important to have  someone help care for you until you are awake and alert.  Return to your normal activities as told by your health care provider. Ask your health care provider what activities are safe for you.  Take over-the-counter and prescription medicines only as told by your health care provider.  If you smoke, do not smoke without supervision.  Keep all follow-up visits as told by your health care provider. This is important. Contact a health care provider if:  You have nausea or vomiting that does not get better with medicine.  You cannot eat or drink without vomiting.  You have pain that does not get better with medicine.  You are unable to pass urine.  You develop a skin rash.  You have a fever.  You have redness around your IV site that gets worse. Get help right away if:  You have difficulty breathing.  You have chest pain.  You have blood in your urine or stool, or you vomit blood. Summary  After the procedure, it is common to have a sore throat or nausea. It is also common to feel tired.  Have a responsible adult stay with you for the time you are told. It is important to have someone help care for you until you are awake and alert.  When you feel hungry, start by eating small amounts of foods that are soft and easy to digest (bland), such as toast. Gradually return to your regular diet.  Drink enough fluid to keep your urine pale yellow.  Return to your normal activities as told by your health care provider.   Ask your health care provider what activities are safe for you. This information is not intended to replace advice given to you by your health care provider. Make sure you discuss any questions you have with your health care provider. Document Revised: 05/26/2020 Document Reviewed: 12/24/2019 Elsevier Patient Education  2021 [place] INSTRUCTIONS FOR KIDNEY STONE/URETERAL STENT   MEDICATIONS:  1. Resume all your other meds from home  2.  Hydrocodone-acetaminophen is for moderate/severe pain, otherwise taking upto 1000 mg every 6 hours of plainTylenol will help treat your pain.   3. Take Cipro one hour prior to removal of your stent.    ACTIVITY:  1. No strenuous activity x 1week  2. No driving while on narcotic pain medications  3. Drink plenty of water  4. Continue to walk at home - you can still get blood clots when you are at home, so keep active, but don't over do it.  5. May return to work/school tomorrow or when you feel ready   BATHING:  1. You can shower and we recommend daily showers  2. You have a string coming from your urethra: The stent string is attached to your ureteral stent. Do not pull on this.   SIGNS/SYMPTOMS TO CALL:  Please call us if you have a fever greater than 101.5, uncontrolled nausea/vomiting, uncontrolled pain, dizziness, unable to urinate, bloody urine, chest pain, shortness of breath, leg swelling, leg pain, redness around wound, drainage from wound, or any other concerns or questions.   You can reach [place] at [phone].   FOLLOW-UP:  1. You have a string attached to your stent, you may remove it on Tuesday, March 29. To do this, pull the string until the stent is completely removed. You may feel an odd sensation in your back.

## 2020-12-16 NOTE — Progress Notes (Signed)
Preop assessment complete. Pt responds to questions with irritation and snide responses.  States that although his covid test came back positive, he knows he is not positive and that it is "the hospital's way of making money".  Explained to Pt reasons he could test positive and be asymptomatic. Provided emotional support and called wife to update her.  Took Pt's glasses and hat back out to her at entrance.

## 2020-12-16 NOTE — Transfer of Care (Signed)
Immediate Anesthesia Transfer of Care Note  Patient: Matthew Bradford  Procedure(s) Performed: CYSTOSCOPY WITH RETROGRADE PYELOGRAM, URETEROSCOPY AND STENT PLACEMENT (Left ) HOLMIUM LASER APPLICATION (Left )  Patient Location: PACU  Anesthesia Type:General  Level of Consciousness: awake  Airway & Oxygen Therapy: Patient Spontanous Breathing and Patient connected to face mask oxygen  Post-op Assessment: Report given to RN and Post -op Vital signs reviewed and stable  Post vital signs: Reviewed and stable  Last Vitals:  Vitals Value Taken Time  BP 136/86 12/16/20 1802  Temp    Pulse 49 12/16/20 1807  Resp 12 12/16/20 1807  SpO2 100 % 12/16/20 1807  Vitals shown include unvalidated device data.  Last Pain:  Vitals:   12/16/20 1426  TempSrc: Oral         Complications: No complications documented.

## 2020-12-17 ENCOUNTER — Encounter (HOSPITAL_COMMUNITY): Payer: Self-pay | Admitting: Urology

## 2020-12-17 ENCOUNTER — Other Ambulatory Visit: Payer: Self-pay

## 2020-12-17 ENCOUNTER — Ambulatory Visit
Admission: EM | Admit: 2020-12-17 | Discharge: 2020-12-17 | Disposition: A | Discharge status: Home / Self Care | Payer: PRIVATE HEALTH INSURANCE | Attending: Physician Assistant | Admitting: Physician Assistant

## 2020-12-17 DIAGNOSIS — R102 Pelvic and perineal pain: Secondary | ICD-10-CM | POA: Insufficient documentation

## 2020-12-17 LAB — POCT URINALYSIS DIP (MANUAL ENTRY)
Bilirubin, UA: NEGATIVE
Glucose, UA: NEGATIVE mg/dL
Ketones, POC UA: NEGATIVE mg/dL
Nitrite, UA: NEGATIVE
Protein Ur, POC: >=300 mg/dL — AB
Spec Grav, UA: 1.025 (ref 1.010–1.025)
Urobilinogen, UA: 0.2 E.U./dL
pH, UA: 5.5 (ref 5.0–8.0)

## 2020-12-17 NOTE — ED Triage Notes (Addendum)
Pt states he had a kidney stone removed and stent placed.  Burning with urination since last night after leaving the hospital. Pt placed on cipro yesterday.

## 2020-12-17 NOTE — Discharge Instructions (Addendum)
TAke your pain medication, drink fluids, and watch for now. We will culture your urine to make sure no active infection. If you worsen go to the ED.

## 2020-12-17 NOTE — ED Provider Notes (Signed)
[facility]    CSN: [id number] Arrival date & time: 12/17/20  1001      History   Chief Complaint Chief Complaint  Patient presents with  . Abdominal Pain    HPI Matthew Bradford is a 60 y.o. male.   Who had a cystoscopy and stone removal yesterday. He has a history of this procedure in the past. He presents with feelings of "pressure" in his bladder and wonders if this is normal. He has not taken his pain medications today. He reports trying to call his surgeon but was unsuccessful. He does not remember feeling this way after the procedure. He had antibiotics prior to procedure. He has been urinating well.      Past Medical History:  Diagnosis Date  . Bladder stone   . Frequency of urination   . Hematuria   . History of cellulitis    03-2016  finger  . History of kidney stones   . Nephrolithiasis   . Urgency of urination   . Wears glasses     Patient Active Problem List   Diagnosis Date Noted  . Anemia 12/17/2016  . History of nephrolithiasis 04/09/2016  . BMI 26.0-26.9,adult 03/31/2016  . Nephrolithiasis 03/31/2016    Past Surgical History:  Procedure Laterality Date  . COLONOSCOPY N/A 06/09/2020   Procedure: COLONOSCOPY;  Surgeon: Rogene Houston, MD;  Location: [facility];  Service: Endoscopy;  Laterality: N/A;  930  . CYSTOSCOPY WITH LITHOLAPAXY N/A 09/07/2016   Procedure: CYSTOSCOPY WITH LITHOLAPAXY;  Surgeon: Franchot Gallo, MD;  Location: [facility];  Service: Urology;  Laterality: N/A;  . CYSTOSCOPY WITH RETROGRADE PYELOGRAM, URETEROSCOPY AND STENT PLACEMENT Left 12/16/2020   Procedure: CYSTOSCOPY WITH RETROGRADE PYELOGRAM, URETEROSCOPY AND STENT PLACEMENT;  Surgeon: Robley Fries, MD;  Location: [facility];  Service: Urology;  Laterality: Left;  45 MINS  . CYSTOSCOPY/RETROGRADE/URETEROSCOPY/STONE EXTRACTION WITH BASKET  1996  . EXTRACORPOREAL SHOCK WAVE LITHOTRIPSY  11/29/2014  . EXTRACORPOREAL SHOCK WAVE LITHOTRIPSY  Left 05/15/2018   Procedure: LEFT EXTRACORPOREAL SHOCK WAVE LITHOTRIPSY (ESWL);  Surgeon: Ardis Hughs, MD;  Location: [facility];  Service: Urology;  Laterality: Left;  . HOLMIUM LASER APPLICATION N/A 54/56/2563   Procedure: HOLMIUM LASER APPLICATION;  Surgeon: Franchot Gallo, MD;  Location: [facility];  Service: Urology;  Laterality: N/A;  . HOLMIUM LASER APPLICATION Left 8/93/7342   Procedure: HOLMIUM LASER APPLICATION;  Surgeon: Robley Fries, MD;  Location: [facility];  Service: Urology;  Laterality: Left;  . I & D EXTREMITY Left 05/09/2016   Procedure: IRRIGATION AND DEBRIDEMENT LEFT INDEX FINGER;  Surgeon: Leandrew Koyanagi, MD;  Location: [place];  Service: Orthopedics;  Laterality: Left;  . POLYPECTOMY  06/09/2020   Procedure: POLYPECTOMY;  Surgeon: Rogene Houston, MD;  Location: [facility];  Service: Endoscopy;;  . TONSILLECTOMY  1979       Home Medications    Prior to Admission medications   Medication Sig Start Date End Date Taking? Authorizing Provider  acetaminophen (TYLENOL) 500 MG tablet Take 2 tablets (1,000 mg total) by mouth every 8 (eight) hours as needed for headache. 06/09/20   Rehman, Mechele Dawley, MD  Chlorpheniramine Maleate (ALLERGY PO) Take 1 tablet by mouth daily as needed (allergies).    [provider]  HYDROcodone-acetaminophen (NORCO/VICODIN) 5-325 MG tablet TAKE 1-2 TABLETS BY MOUTH EVERY 6 HOURS AS NEEDED FOR SEVERE KIDNEY STONE PAIN 12/12/20   [provider]  ketorolac (TORADOL) 10 MG  tablet Take 10 mg by mouth 2 (two) times daily as needed for moderate pain or severe pain. 12/12/20   [provider]    Family History No family history on file.  Social History Social History   Tobacco Use  . Smoking status: Former Smoker    Years: 30.00    Types: Cigarettes    Quit date: 12/23/2009    Years since quitting: 10.9  . Smokeless tobacco: Current User    Types: Chew  . Tobacco comment:  occasional chew tobacco  Vaping Use  . Vaping Use: Never used  Substance Use Topics  . Alcohol use: No  . Drug use: No     Allergies   Ibuprofen   Review of Systems Review of Systems  Constitutional: Negative for fatigue and fever.  Genitourinary: Positive for hematuria and urgency. Negative for dysuria.  Musculoskeletal: Negative for back pain and joint swelling.     Physical Exam Triage Vital Signs ED Triage Vitals  Enc Vitals Group     BP 12/17/20 1010 (!) 185/110     Pulse Rate 12/17/20 1010 74     Resp 12/17/20 1010 17     Temp 12/17/20 1010 98.9 F (37.2 C)     Temp Source 12/17/20 1010 Oral     SpO2 12/17/20 1010 96 %     Weight --      Height --      Head Circumference --      Peak Flow --      Pain Score 12/17/20 1008 3     Pain Loc --      Pain Edu? --      Excl. in [place]? --    No data found.  Updated Vital Signs BP (!) 185/110 (BP Location: Right Arm)   Pulse 74   Temp 98.9 F (37.2 C) (Oral)   Resp 17   SpO2 96%   Visual Acuity Right Eye Distance:   Left Eye Distance:   Bilateral Distance:    Right Eye Near:   Left Eye Near:    Bilateral Near:     Physical Exam Vitals and nursing note reviewed.  Constitutional:      General: He is not in acute distress.    Appearance: He is well-developed and normal weight. He is not ill-appearing.  Abdominal:     General: Abdomen is flat. Bowel sounds are normal.     Palpations: Abdomen is soft.     Comments: No mass or bladder enlargement palpated in his suprapubic region and no frank tenderness to palpation  Neurological:     General: No focal deficit present.     Mental Status: He is alert.  Psychiatric:        Mood and Affect: Mood normal.        Behavior: Behavior normal.      UC Treatments / Results  Labs (all labs ordered are listed, but only abnormal results are displayed) Labs Reviewed  POCT URINALYSIS DIP (MANUAL ENTRY) - Abnormal; Notable for the following components:       Result Value   Color, UA orange (*)    Clarity, UA hazy (*)    Blood, UA large (*)    Protein Ur, POC >=300 (*)    Leukocytes, UA Trace (*)    All other components within normal limits  URINE CULTURE    EKG   Radiology DG C-Arm 1-60 Min-No Report  Result Date: 12/16/2020 Fluoroscopy was utilized by the requesting physician.  No radiographic interpretation.    Procedures Procedures (including critical care time)  Medications Ordered in UC Medications - No data to display  Initial Impression / Assessment and Plan / UC Course  I have reviewed the triage vital signs and the nursing notes.  Pertinent labs & imaging results that were available during my care of the patient were reviewed by me and considered in my medical decision making (see chart for details).     His exam was benign, urine with blood as expected, pre procedure antibiotic  were given. Will send for culture, but instructed him to take pain medications and monitor for now. Attempt to reach the surgeon again for questions. If worsens call Final Clinical Impressions(s) / UC Diagnoses   Final diagnoses:  Pelvic pain     Discharge Instructions     TAke your pain medication, drink fluids, and watch for now. We will culture your urine to make sure no active infection. If you worsen go to the ED.    ED Prescriptions    None     PDMP not reviewed this encounter.   Bjorn Pippin, PA-C 12/17/20 1600

## 2020-12-18 LAB — URINE CULTURE: Culture: NO GROWTH

## 2021-04-17 IMAGING — CT CT RENAL STONE PROTOCOL
2 of 4 series · 15 of 46 positions shown, 17 images · non-contrast
Comparison: CT 05/12/2018

CLINICAL DATA: Flank pain, stone disease suspected, left flank
pain. History of urolithiasis.

EXAM:
CT ABDOMEN AND PELVIS WITHOUT CONTRAST
TECHNIQUE: Multidetector CT imaging of the abdomen and pelvis was performed
following the standard protocol without IV contrast.

[Series 3: ap without · axial · non-contrast · 0.82mm/px · z∈[+696,+1136]mm · 12 of 98 slices shown, 14 images]
[im 5/98  soft-tissue]
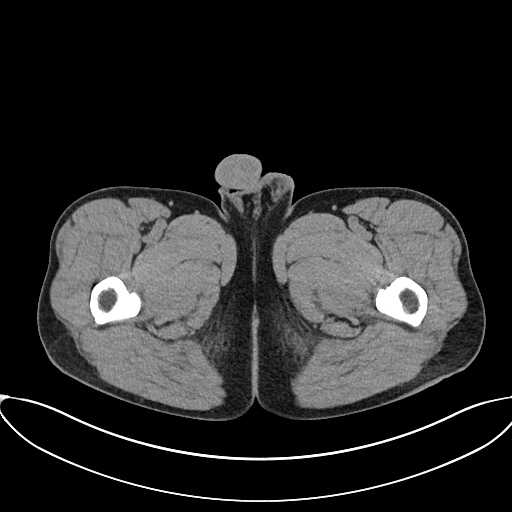
[im 5/98  bone]
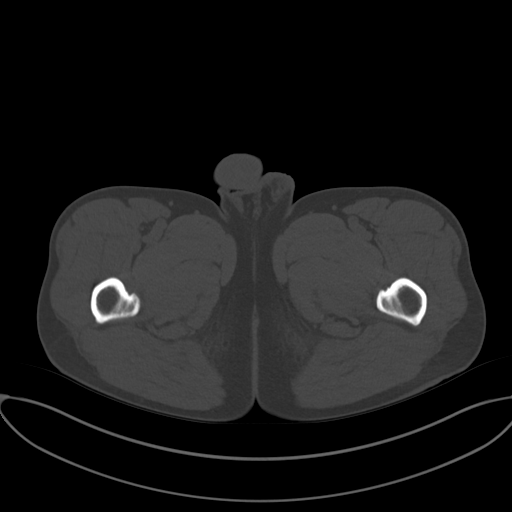
[im 15/98  soft-tissue]
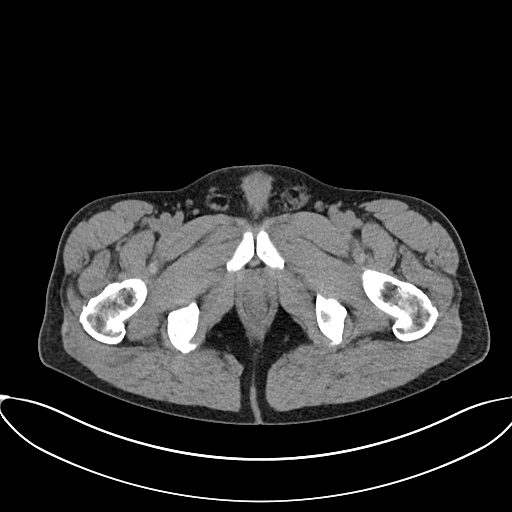
[im 20/98  soft-tissue]
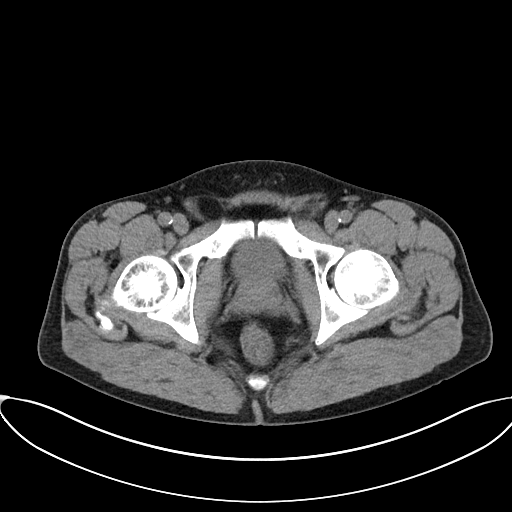
[im 30/98  soft-tissue]
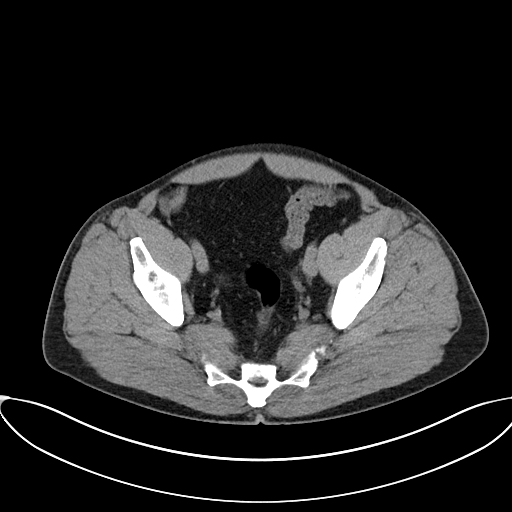
[im 39/98  soft-tissue]
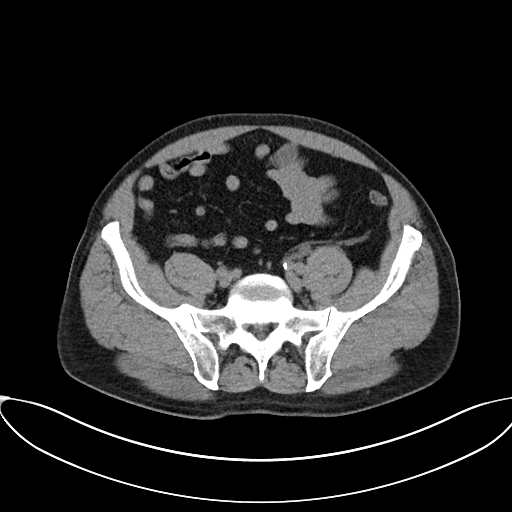
[im 44/98  soft-tissue]
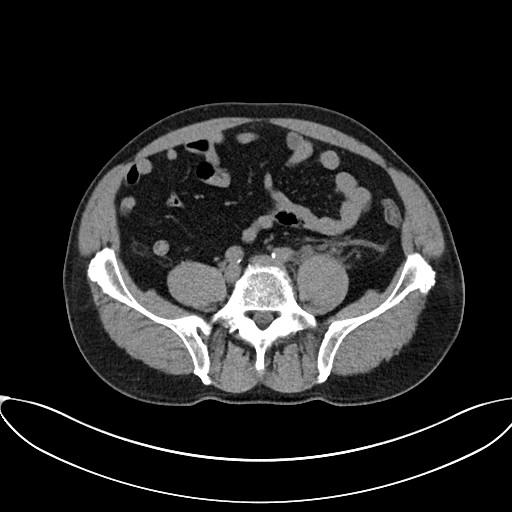
[im 54/98  soft-tissue]
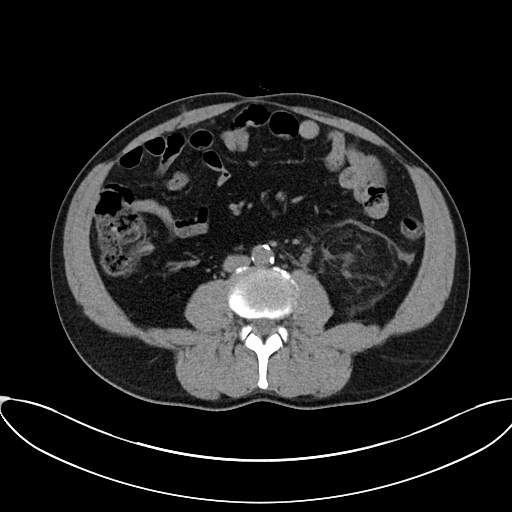
[im 59/98  soft-tissue]
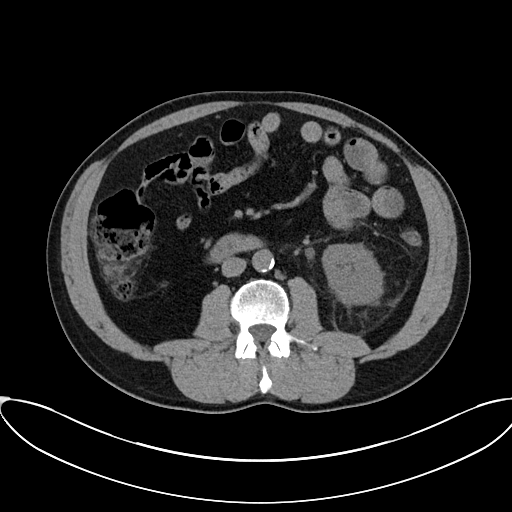
[im 68/98  soft-tissue]
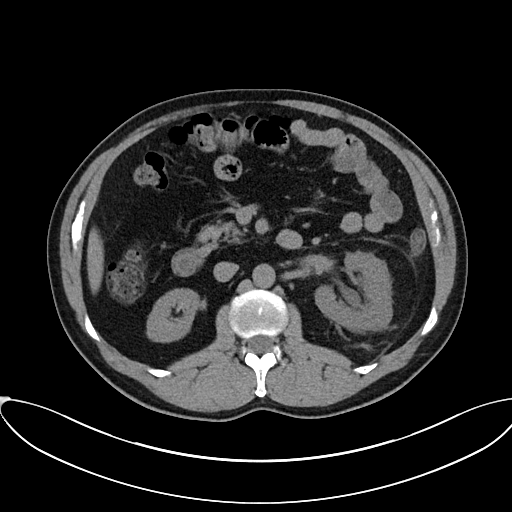
[im 68/98  bone]
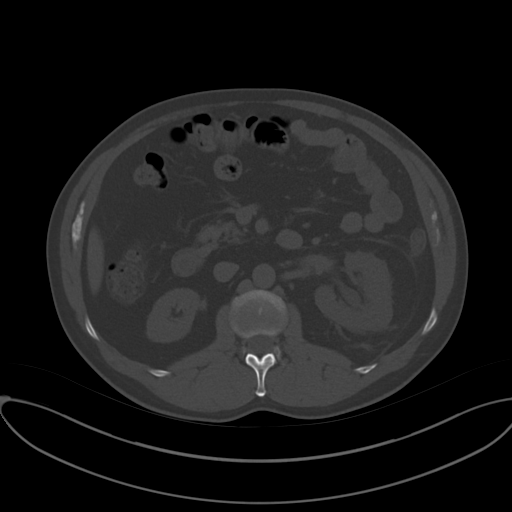
[im 78/98  soft-tissue]
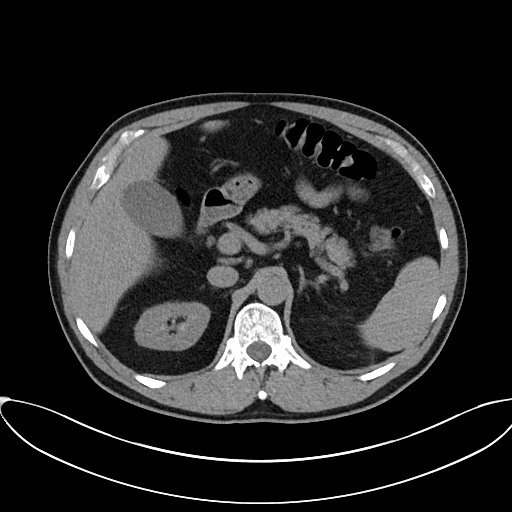
[im 83/98  soft-tissue]
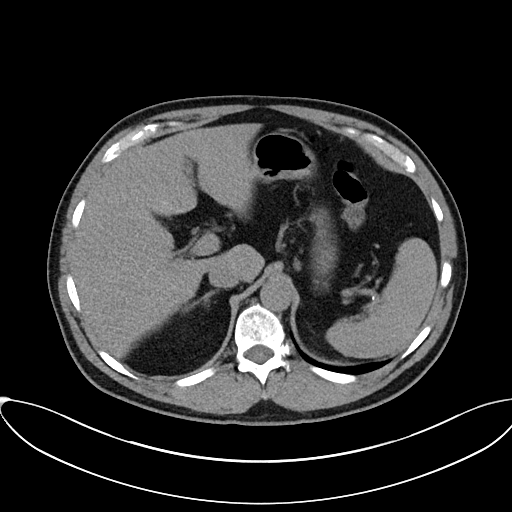
[im 93/98  soft-tissue]
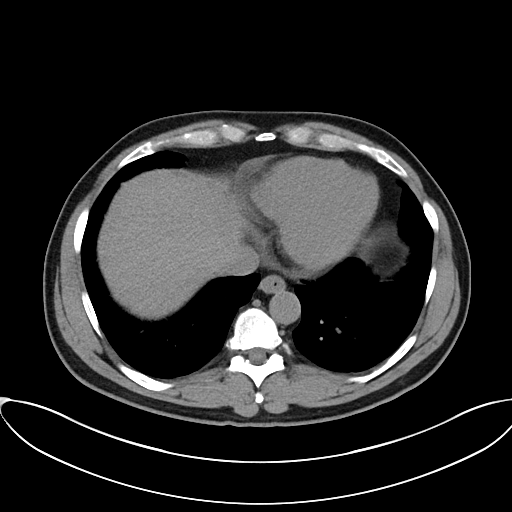

[Series 6: cor · coronal · 0.77mm/px · 3 of 98 slices shown]
[im 33/98  soft-tissue]
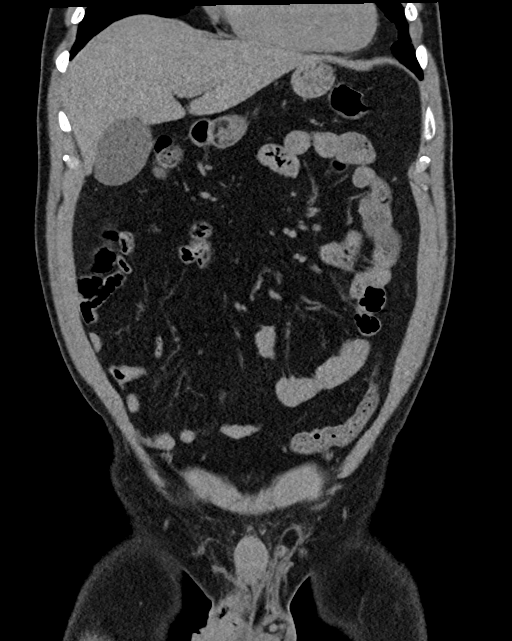
[im 44/98  soft-tissue]
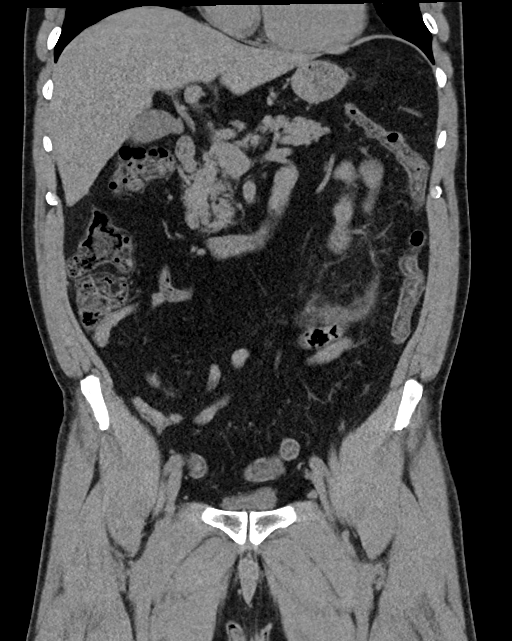
[im 54/98  soft-tissue]
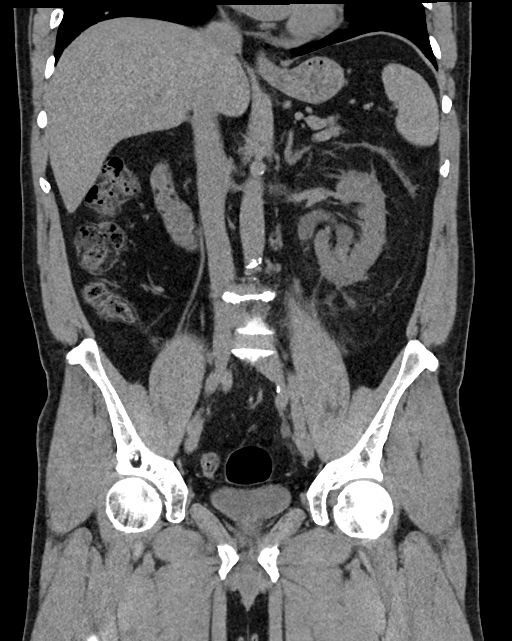

[15 of 46 positions shown; findings below may reference images not displayed]

FINDINGS: Lower chest: Bandlike areas of opacity, likely atelectasis or
scarring. Otherwise clear lung bases. Normal cardiac size. Trace
pericardial effusion slightly increased from prior.

Hepatobiliary: No focal liver abnormality is seen. No gallstones,
gallbladder wall thickening, or biliary dilatation.

Pancreas: Unremarkable. No pancreatic ductal dilatation or
surrounding inflammatory changes.

Spleen: Normal in size without focal abnormality.

Adrenals/Urinary Tract: Normal adrenal glands. Hydroureteronephrosis
to the level of a 5 mm calculus in the distal left ureter just
proximal to the left ureterovesicular junction (3/76). There is
asymmetric stranding about the left kidney and ureter, most
pronounced within the perirenal space with additional stranding and
likely reactive free fluid in the left anterior and posterior
pararenal spaces. Additional punctate nonobstructing calculus seen
in the interpolar left kidney. No right urolithiasis or urinary
tract dilatation. No visible or contour deforming renal lesions.
Urinary bladder is largely decompressed at the time of exam and
therefore poorly evaluated by CT imaging. No gross bladder
abnormality.

Stomach/Bowel: Distal esophagus, stomach and duodenal sweep are
unremarkable. No small bowel wall thickening or dilatation. No
evidence of obstruction. There is extensive intramural fat within
the proximal and distal colon as well as the appendix. No focal
periappendiceal fat stranding or inflammation. Trace pericolonic
stranding at the splenic flexure and proximal descending colon is
likely reactive to the adjacent renal process. No frank colonic wall
thickening or dilatation. Scattered colonic diverticula without
focal pericolonic inflammation to suggest diverticulitis.

Vascular/Lymphatic: Atherosclerotic calcifications throughout the
abdominal aorta and branch vessels. No aneurysm or ectasia. No
enlarged abdominopelvic lymph nodes.

Reproductive: Coarse eccentric calcification of the prostate. No
concerning abnormalities of the prostate or seminal vesicles.

Other: Retroperitoneal stranding and trace reactive free fluid, as
above. No intraperitoneal free fluid. No abdominopelvic free air. No
bowel containing hernias. Small ventral diastasis and fat containing
bilateral inguinal hernias.

Musculoskeletal: No acute osseous abnormality or suspicious osseous
lesion. Multilevel degenerative changes are present in the imaged
portions of the spine. Additional mild-to-moderate degenerative
changes in the pelvis and hips.
IMPRESSION: 1. Hydroureteronephrosis to the level of a 5 mm calculus in the
distal left ureter just proximal to the left ureterovesicular
junction. Extensive left perinephric and periureteral inflammatory
changes, detailed above.
2. Additional punctate nonobstructing calculus in the interpolar
left kidney.
3. Extensive intramural fat within the proximal and distal colon as
well as the appendix. This finding is nonspecific but can be seen in
the setting of chronic inflammatory bowel disease or secondary to
obese body habitus.
4. Colonic diverticulosis without evidence of diverticulitis.
5. Trace pericardial effusion slightly increased from prior.
6. Aortic Atherosclerosis (EFMH3-R08.8).

## 2021-08-10 ENCOUNTER — Other Ambulatory Visit: Payer: Self-pay

## 2021-08-10 ENCOUNTER — Ambulatory Visit
Admission: EM | Admit: 2021-08-10 | Discharge: 2021-08-10 | Disposition: A | Discharge status: Home / Self Care | Payer: No Typology Code available for payment source | Attending: Emergency Medicine | Admitting: Emergency Medicine

## 2021-08-10 DIAGNOSIS — M79652 Pain in left thigh: Secondary | ICD-10-CM

## 2021-08-10 MED ORDER — HYDROCODONE-ACETAMINOPHEN 5-325 MG PO TABS: 1.0000 | ORAL_TABLET | Freq: Four times a day (QID) | ORAL | 0 refills | Status: DC | PRN

## 2021-08-10 MED ORDER — TIZANIDINE HCL 4 MG PO TABS: 4.0000 mg | ORAL_TABLET | Freq: Three times a day (TID) | ORAL | 0 refills | Status: DC | PRN

## 2021-08-10 NOTE — ED Triage Notes (Addendum)
Patient presents to Urgent Care with complaints of left leg pain since today. Pt states he his leg slipped from the truck step. He believes he pulled a muscle.

## 2021-08-10 NOTE — ED Provider Notes (Signed)
HPI  SUBJECTIVE:  Matthew Bradford is a 60 y.o. male who presents with constant left medial/anterior thigh pain described as cramping, burning, sharp with radiation down his leg, and knee swelling starting earlier today.  Patient states that he slipped getting into his truck followed by the pain while at work.  He denies direct trauma to the thigh or knee.  He states that he has been unable to bear weight on this.  No recent fluoroquinolone use.  He has tried ice with some improvement in his symptoms.  Symptoms are worse with standing, all movement of his leg.  He has no past medical history. He is not sure if this is going to be a Workmen's Comp. case. EVO:JJKK, Edwinna Areola, MD    Past Medical History:  Diagnosis Date   Bladder stone    Frequency of urination    Hematuria    History of cellulitis    03-2016  finger   History of kidney stones    Nephrolithiasis    Urgency of urination    Wears glasses     Past Surgical History:  Procedure Laterality Date   COLONOSCOPY N/A 06/09/2020   Procedure: COLONOSCOPY;  Surgeon: Rogene Houston, MD;  [facility];  Service: Endoscopy;  Laterality: N/A;  930   CYSTOSCOPY WITH LITHOLAPAXY N/A 09/07/2016   Procedure: CYSTOSCOPY WITH LITHOLAPAXY;  Surgeon: Franchot Gallo, MD;  Location: [facility];  Service: Urology;  Laterality: N/A;   CYSTOSCOPY WITH RETROGRADE PYELOGRAM, URETEROSCOPY AND STENT PLACEMENT Left 12/16/2020   Procedure: CYSTOSCOPY WITH RETROGRADE PYELOGRAM, URETEROSCOPY AND STENT PLACEMENT;  Surgeon: Robley Fries, MD;  Location: [facility];  Service: Urology;  Laterality: Left;  45 MINS   CYSTOSCOPY/RETROGRADE/URETEROSCOPY/STONE EXTRACTION WITH BASKET  1996   EXTRACORPOREAL SHOCK WAVE LITHOTRIPSY  11/29/2014   EXTRACORPOREAL SHOCK WAVE LITHOTRIPSY Left 05/15/2018   Procedure: LEFT EXTRACORPOREAL SHOCK WAVE LITHOTRIPSY (ESWL);  Surgeon: Ardis Hughs, MD;  Location: [facility];  Service: Urology;  Laterality:  Left;   HOLMIUM LASER APPLICATION N/A 93/81/8299   Procedure: HOLMIUM LASER APPLICATION;  Surgeon: Franchot Gallo, MD;  Location: [facility];  Service: Urology;  Laterality: N/A;   HOLMIUM LASER APPLICATION Left 3/71/6967   Procedure: HOLMIUM LASER APPLICATION;  Surgeon: Robley Fries, MD;  Location: [facility];  Service: Urology;  Laterality: Left;   I & D EXTREMITY Left 05/09/2016   Procedure: IRRIGATION AND DEBRIDEMENT LEFT INDEX FINGER;  Surgeon: Leandrew Koyanagi, MD;  Location: [place];  Service: Orthopedics;  Laterality: Left;   POLYPECTOMY  06/09/2020   Procedure: POLYPECTOMY;  Surgeon: Rogene Houston, MD;  [facility]: [facility];  Service: Endoscopy;;   TONSILLECTOMY  1979    History reviewed. No pertinent family history.  Social History   Tobacco Use   Smoking status: Former    Years: 30.00    Types: Cigarettes    Quit date: 12/23/2009    Years since quitting: 11.6   Smokeless tobacco: Current    Types: Chew   Tobacco comments:    occasional chew tobacco  Vaping Use   Vaping Use: Never used  Substance Use Topics   Alcohol use: No   Drug use: No    No current facility-administered medications for this encounter.  Current Outpatient Medications:    HYDROcodone-acetaminophen (NORCO/VICODIN) 5-325 MG tablet, Take 1-2 tablets by mouth every 6 (six) hours as needed for moderate pain or severe pain., Disp: 12 tablet, Rfl: 0  tiZANidine (ZANAFLEX) 4 MG tablet, Take 1 tablet (4 mg total) by mouth every 8 (eight) hours as needed for muscle spasms., Disp: 30 tablet, Rfl: 0   acetaminophen (TYLENOL) 500 MG tablet, Take 2 tablets (1,000 mg total) by mouth every 8 (eight) hours as needed for headache., Disp: 30 tablet, Rfl: 0   Chlorpheniramine Maleate (ALLERGY PO), Take 1 tablet by mouth daily as needed (allergies)., Disp: , Rfl:   Allergies  Allergen Reactions   Ibuprofen Other (See Comments)    "feels like gas build up in abdomen"      ROS  As noted in HPI.   Physical Exam  BP (!) 161/87 (BP Location: Left Arm)   Pulse 64   Temp 98.4 F (36.9 C) (Oral)   Resp 16   SpO2 94%    Constitutional: Well developed, well nourished, appears to be in moderate painful distress Eyes:  EOMI, conjunctiva normal bilaterally HENT: Normocephalic, atraumatic,mucus membranes moist Respiratory: Normal inspiratory effort Cardiovascular: Normal rate GI: nondistended skin: No rash, skin intact Musculoskeletal: Left leg: Positive suprapatellar swelling.  Positive tenderness along the superior medial kneecap along the medial thigh.  Difficult to tell if there is a soft tissue defect due to the swelling.  Limited range of motion secondary to the pain.  DP 2+.  Sensation distally grossly intact.  Knee otherwise nontender.  Unable to fully extend knee secondary to pain. Neurologic: Alert & oriented x 3, no focal neuro deficits Psychiatric: Speech and behavior appropriate   ED Course   Medications - No data to display  Orders Placed This Encounter  Procedures   Crutches    Standing Status:   Standing    Number of Occurrences:   1    No results found for this or any previous visit (from the past 24 hour(s)). No results found.  ED Clinical Impression  1. Acute pain of left thigh      ED Assessment/Plan  [place] Narcotic database reviewed for this patient, and feel that the risk/benefit ratio today is favorable for proceeding with a prescription for controlled substance.  Last opiate prescription was in March 2020  Offered to give patient shot of Toradol and Tylenol, patient states that he cannot tolerate NSAIDs.  Deferring Toradol.  Concern for quadriceps tear with the swelling at his knee versus strain with spasm.  He denies direct trauma, so do not think that he has a fracture.  Deferring x-ray.  Discussed with patient and spouse that he may need an ultrasound or an MRI to determine the exact injury.  Will send home with  Tylenol containing product 3-4 times a day.  Either 1000 mg of Tylenol for mild to moderate pain, 1-2 Norco for severe pain.  Patient states he also has a prescription of Norco at home for nephrolithiasis.  Will also send home with Zanaflex.  Crutches.  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  Discussed MDM, treatment plan, and plan for follow-up with spouse and patient. patient agrees with plan.   Meds ordered this encounter  Medications   HYDROcodone-acetaminophen (NORCO/VICODIN) 5-325 MG tablet    Sig: Take 1-2 tablets by mouth every 6 (six) hours as needed for moderate pain or severe pain.    Dispense:  12 tablet    Refill:  0   tiZANidine (ZANAFLEX) 4 MG tablet    Sig: Take  1 tablet (4 mg total) by mouth every 8 (eight) hours as needed for muscle spasms.    Dispense:  30 tablet    Refill:  0      *This clinic note was created using [employer]. Therefore, there may be occasional mistakes despite careful proofreading.  ?    Melynda Ripple, MD 08/11/21 [phone]

## 2021-08-10 NOTE — Discharge Instructions (Addendum)
Tylenol containing product 3-4 times a day.  Either 1000 mg of Tylenol for mild to moderate pain, 1-2 Norco for severe pain.  Zanaflex will help with muscle spasms.  Please follow-up with occupational health in [place] if you decide to file this as Workmen's Comp.  They will refer you to orthopedics if they deem necessary.  If you decide to not file this is Copy, you can follow-up with your orthopedic surgeon ASAP.  Ice.  Zanaflex will help with muscle spasms.  I am concerned that you may have torn your quadricep.  You may need a ultrasound or an MRI to determine the extent of the injury.

## 2021-08-14 ENCOUNTER — Other Ambulatory Visit: Payer: Self-pay

## 2021-08-14 ENCOUNTER — Encounter (HOSPITAL_COMMUNITY): Payer: Self-pay | Admitting: Orthopedic Surgery

## 2021-08-14 NOTE — H&P (Addendum)
PREOPERATIVE H&P  Chief Complaint: LEFT QUAD TENDON RUPTURE  HPI: Matthew Bradford is a 60 y.o. male who presents with a diagnosis of LEFT QUAD TENDON RUPTURE. He slipped getting out of his truck and had immediate knee pain and swelling. Symptoms are rated as moderate to severe, and have been worsening.  This is significantly impairing activities of daily living.  He has elected for surgical management.   Past Medical History:  Diagnosis Date   Bladder stone    Frequency of urination    Hematuria    History of cellulitis    03-2016  finger   History of kidney stones    Nephrolithiasis    Urgency of urination    Wears glasses    Past Surgical History:  Procedure Laterality Date   COLONOSCOPY N/A 06/09/2020   Procedure: COLONOSCOPY;  Surgeon: Rogene Houston, MD;  [facility];  Service: Endoscopy;  Laterality: N/A;  930   CYSTOSCOPY WITH LITHOLAPAXY N/A 09/07/2016   Procedure: CYSTOSCOPY WITH LITHOLAPAXY;  Surgeon: Franchot Gallo, MD;  Location: [facility];  Service: Urology;  Laterality: N/A;   CYSTOSCOPY WITH RETROGRADE PYELOGRAM, URETEROSCOPY AND STENT PLACEMENT Left 12/16/2020   Procedure: CYSTOSCOPY WITH RETROGRADE PYELOGRAM, URETEROSCOPY AND STENT PLACEMENT;  Surgeon: Robley Fries, MD;  [facility]: [facility];  Service: Urology;  Laterality: Left;  45 MINS   CYSTOSCOPY/RETROGRADE/URETEROSCOPY/STONE EXTRACTION WITH BASKET  1996   EXTRACORPOREAL SHOCK WAVE LITHOTRIPSY  11/29/2014   EXTRACORPOREAL SHOCK WAVE LITHOTRIPSY Left 05/15/2018   Procedure: LEFT EXTRACORPOREAL SHOCK WAVE LITHOTRIPSY (ESWL);  Surgeon: Ardis Hughs, MD;  Location: [facility];  Service: Urology;  Laterality: Left;   HOLMIUM LASER APPLICATION N/A 72/53/6644   Procedure: HOLMIUM LASER APPLICATION;  Surgeon: Franchot Gallo, MD;  [facility];  Service: Urology;  Laterality: N/A;   HOLMIUM LASER APPLICATION Left 0/34/7425   Procedure: HOLMIUM LASER  APPLICATION;  Surgeon: Robley Fries, MD;  Location: [facility];  Service: Urology;  Laterality: Left;   I & D EXTREMITY Left 05/09/2016   Procedure: IRRIGATION AND DEBRIDEMENT LEFT INDEX FINGER;  Surgeon: Leandrew Koyanagi, MD;  Location: [place];  Service: Orthopedics;  Laterality: Left;   POLYPECTOMY  06/09/2020   Procedure: POLYPECTOMY;  Surgeon: Rogene Houston, MD;  [facility];  Service: Endoscopy;;   TONSILLECTOMY  1979   Social History   Socioeconomic History   Marital status: Married    Spouse name: Not on file   Number of children: Not on file   Years of education: Not on file   Highest education level: Not on file  Occupational History   Not on file  Tobacco Use   Smoking status: Former    Years: 30.00    Types: Cigarettes    Quit date: 12/23/2009    Years since quitting: 11.6   Smokeless tobacco: Current    Types: Chew   Tobacco comments:    occasional chew tobacco  Vaping Use   Vaping Use: Never used  Substance and Sexual Activity   Alcohol use: No   Drug use: No   Sexual activity: Not on file  Other Topics Concern   Not on file  Social History Narrative   Not on file   Social Determinants of Health   Financial Resource Strain: Not on file  Food Insecurity: Not on file  Transportation Needs: Not on file  Physical Activity: Not on file  Stress: Not on file  Social Connections:  Not on file   No family history on file. Allergies  Allergen Reactions   Ibuprofen Other (See Comments)    "feels like gas build up in abdomen"   Prior to Admission medications   Medication Sig Start Date End Date Taking? Authorizing Provider  acetaminophen (TYLENOL) 500 MG tablet Take 2 tablets (1,000 mg total) by mouth every 8 (eight) hours as needed for headache. 06/09/20   Rehman, Mechele Dawley, MD  Chlorpheniramine Maleate (ALLERGY PO) Take 1 tablet by mouth daily as needed (allergies).    [provider]  HYDROcodone-acetaminophen  (NORCO/VICODIN) 5-325 MG tablet Take 1-2 tablets by mouth every 6 (six) hours as needed for moderate pain or severe pain. 08/10/21   Melynda Ripple, MD  tiZANidine (ZANAFLEX) 4 MG tablet Take 1 tablet (4 mg total) by mouth every 8 (eight) hours as needed for muscle spasms. 08/10/21   Melynda Ripple, MD     Positive ROS: All other systems have been reviewed and were otherwise negative with the exception of those mentioned in the HPI and as above.  Physical Exam: General: Alert, no acute distress Cardiovascular: No pedal edema Respiratory: No cyanosis, no use of accessory musculature GI: No organomegaly, abdomen is soft and non-tender Skin: No lesions in the area of chief complaint Neurologic: Sensation intact distally Psychiatric: Patient is competent for consent with normal mood and affect Lymphatic: No axillary or cervical lymphadenopathy  MUSCULOSKELETAL: LLE suprapatellar swelling.  TTP along the superior medial kneecap.  Difficult to tell if there is a soft tissue defect due to the swelling. Limited ROM d/t pain. Unable to fully extend knee. NVI.    Imaging: ultrasound shows disruption of the fibers of the left quad tendon where it inserts at the superior pole of the patella   Assessment: LEFT QUAD TENDON RUPTURE  Plan: Plan for Procedure(s): REPAIR QUADRICEP TENDON  The risks benefits and alternatives were discussed with the patient including but not limited to the risks of nonoperative treatment, versus surgical intervention including infection, bleeding, nerve injury,  blood clots, cardiopulmonary complications, morbidity, mortality, among others, and they were willing to proceed.   Weightbearing: WBAT LLE while in brace, no knee extension allowed Orthopedic devices: KI Showering: POD 3 Dressing: reinforce as needed Medicines: ASA, Norco 10, Tylenol, Robaxin, Zofran Can't tolerate NSAIDS  Discharge: home Follow up: 2 weeks post op    Alisa Graff Office [phone] 08/14/2021 10:13 AM

## 2021-08-14 NOTE — Progress Notes (Signed)
patient voiced understanding of new arrival time of 0915

## 2021-08-14 NOTE — Progress Notes (Signed)
Matthew Bradford denies chest pain or shortness of breath.  Patient denies having any s/s of Covid in his household.  Patient denies any known exposure to Covid.   PCP is Dr. Casimer Leek.   I instructed patient to shower with antibiotic soap, if it is available.  Dry off with a clean towel. Do not put lotion, powder, cologne or deodorant or makeup.No jewelry or piercings. Men may shave their face and neck. Woman should not shave. No nail polish, artificial or acrylic nails. Wear clean clothes, brush your teeth. Glasses, contact lens,dentures or partials may not be worn in the OR. If you need to wear them, please bring a case for glasses, do not wear contacts or bring a case, the hospital does not have contact cases, dentures or partials will have to be removed , make sure they are clean, we will provide a denture cup to put them in. You will need some one to drive you home and a responsible person over the age of 18 to stay with you for the first 24 hours after surgery.

## 2021-08-15 ENCOUNTER — Ambulatory Visit (HOSPITAL_COMMUNITY)
Admission: RE | Admit: 2021-08-15 | Discharge: 2021-08-15 | Disposition: A | Discharge status: Home / Self Care | Payer: Worker's Compensation | Attending: Orthopedic Surgery | Admitting: Orthopedic Surgery

## 2021-08-15 ENCOUNTER — Encounter (HOSPITAL_COMMUNITY): Payer: Self-pay | Admitting: Orthopedic Surgery

## 2021-08-15 ENCOUNTER — Encounter (HOSPITAL_COMMUNITY)
Admission: RE | Disposition: A | Discharge status: Home / Self Care | Payer: Self-pay | Source: Home / Self Care | Attending: Orthopedic Surgery

## 2021-08-15 ENCOUNTER — Ambulatory Visit (HOSPITAL_COMMUNITY): Payer: Worker's Compensation | Admitting: Certified Registered Nurse Anesthetist

## 2021-08-15 DIAGNOSIS — W010XXA Fall on same level from slipping, tripping and stumbling without subsequent striking against object, initial encounter: Secondary | ICD-10-CM | POA: Insufficient documentation

## 2021-08-15 DIAGNOSIS — Z87891 Personal history of nicotine dependence: Secondary | ICD-10-CM | POA: Insufficient documentation

## 2021-08-15 DIAGNOSIS — S76112A Strain of left quadriceps muscle, fascia and tendon, initial encounter: Secondary | ICD-10-CM

## 2021-08-15 DIAGNOSIS — S76102A Unspecified injury of left quadriceps muscle, fascia and tendon, initial encounter: Secondary | ICD-10-CM | POA: Insufficient documentation

## 2021-08-15 DIAGNOSIS — Y9389 Activity, other specified: Secondary | ICD-10-CM | POA: Insufficient documentation

## 2021-08-15 HISTORY — PX: QUADRICEPS TENDON REPAIR: SHX756

## 2021-08-15 LAB — CBC
HCT: 41.5 % (ref 39.0–52.0)
Hemoglobin: 15.2 g/dL (ref 13.0–17.0)
MCH: 33.4 pg (ref 26.0–34.0)
MCHC: 36.6 g/dL — ABNORMAL HIGH (ref 30.0–36.0)
MCV: 91.2 fL (ref 80.0–100.0)
Platelets: 190 10*3/uL (ref 150–400)
RBC: 4.55 MIL/uL (ref 4.22–5.81)
RDW: 11.6 % (ref 11.5–15.5)
WBC: 6.1 10*3/uL (ref 4.0–10.5)
nRBC: 0 % (ref 0.0–0.2)

## 2021-08-15 SURGERY — REPAIR, TENDON, QUADRICEPS
Anesthesia: Regional | Laterality: Left

## 2021-08-15 MED ORDER — 0.9 % SODIUM CHLORIDE (POUR BTL) OPTIME
TOPICAL | Status: DC | PRN
  Administered 2021-08-15: 1000 mL

## 2021-08-15 MED ORDER — CHLORHEXIDINE GLUCONATE 0.12 % MT SOLN: 15.0000 mL | Freq: Once | OROMUCOSAL | Status: AC

## 2021-08-15 MED ORDER — MIDAZOLAM HCL 2 MG/2ML IJ SOLN
INTRAMUSCULAR | Status: AC
  Filled 2021-08-15: qty 2

## 2021-08-15 MED ORDER — PROMETHAZINE HCL 25 MG/ML IJ SOLN: 6.2500 mg | INTRAMUSCULAR | Status: DC | PRN

## 2021-08-15 MED ORDER — FENTANYL CITRATE (PF) 100 MCG/2ML IJ SOLN: 50.0000 ug | Freq: Once | INTRAMUSCULAR | Status: AC

## 2021-08-15 MED ORDER — EPHEDRINE 5 MG/ML INJ
INTRAVENOUS | Status: AC
  Filled 2021-08-15: qty 5

## 2021-08-15 MED ORDER — ROPIVACAINE HCL 5 MG/ML IJ SOLN
INTRAMUSCULAR | Status: DC | PRN
  Administered 2021-08-15: 30 mL via PERINEURAL

## 2021-08-15 MED ORDER — ACETAMINOPHEN 500 MG PO TABS: 1000.0000 mg | ORAL_TABLET | Freq: Three times a day (TID) | ORAL | 0 refills | Status: DC | PRN

## 2021-08-15 MED ORDER — LIDOCAINE 2% (20 MG/ML) 5 ML SYRINGE
INTRAMUSCULAR | Status: DC | PRN
  Administered 2021-08-15: 80 mg via INTRAVENOUS

## 2021-08-15 MED ORDER — DEXAMETHASONE SODIUM PHOSPHATE 10 MG/ML IJ SOLN
INTRAMUSCULAR | Status: AC
  Filled 2021-08-15: qty 1

## 2021-08-15 MED ORDER — MIDAZOLAM HCL 2 MG/2ML IJ SOLN: 1.0000 mg | Freq: Once | INTRAMUSCULAR | Status: AC

## 2021-08-15 MED ORDER — ASPIRIN EC 81 MG PO TBEC: 81.0000 mg | DELAYED_RELEASE_TABLET | Freq: Every day | ORAL | 0 refills | Status: DC

## 2021-08-15 MED ORDER — MIDAZOLAM HCL 5 MG/5ML IJ SOLN
INTRAMUSCULAR | Status: DC | PRN
  Administered 2021-08-15: 1 mg via INTRAVENOUS

## 2021-08-15 MED ORDER — LACTATED RINGERS IV SOLN: INTRAVENOUS | Status: DC

## 2021-08-15 MED ORDER — ACETAMINOPHEN 500 MG PO TABS
1000.0000 mg | ORAL_TABLET | Freq: Once | ORAL | Status: AC
  Administered 2021-08-15: 1000 mg via ORAL
  Filled 2021-08-15: qty 2

## 2021-08-15 MED ORDER — GLYCOPYRROLATE PF 0.2 MG/ML IJ SOSY
PREFILLED_SYRINGE | INTRAMUSCULAR | Status: AC
  Filled 2021-08-15: qty 1

## 2021-08-15 MED ORDER — ONDANSETRON 4 MG PO TBDP: 4.0000 mg | ORAL_TABLET | Freq: Three times a day (TID) | ORAL | 0 refills | Status: DC | PRN

## 2021-08-15 MED ORDER — DEXAMETHASONE SODIUM PHOSPHATE 10 MG/ML IJ SOLN
8.0000 mg | Freq: Once | INTRAMUSCULAR | Status: AC
  Administered 2021-08-15: 8 mg via INTRAVENOUS
  Filled 2021-08-15: qty 1

## 2021-08-15 MED ORDER — FENTANYL CITRATE (PF) 100 MCG/2ML IJ SOLN
INTRAMUSCULAR | Status: DC | PRN
  Administered 2021-08-15: 50 ug via INTRAVENOUS

## 2021-08-15 MED ORDER — ONDANSETRON HCL 4 MG/2ML IJ SOLN
INTRAMUSCULAR | Status: AC
  Filled 2021-08-15: qty 2

## 2021-08-15 MED ORDER — FENTANYL CITRATE (PF) 100 MCG/2ML IJ SOLN: 25.0000 ug | INTRAMUSCULAR | Status: DC | PRN

## 2021-08-15 MED ORDER — FENTANYL CITRATE (PF) 100 MCG/2ML IJ SOLN
INTRAMUSCULAR | Status: AC
  Administered 2021-08-15: 50 ug via INTRAVENOUS
  Filled 2021-08-15: qty 2

## 2021-08-15 MED ORDER — CHLORHEXIDINE GLUCONATE 0.12 % MT SOLN
OROMUCOSAL | Status: AC
  Administered 2021-08-15: 15 mL via OROMUCOSAL
  Filled 2021-08-15: qty 15

## 2021-08-15 MED ORDER — ONDANSETRON HCL 4 MG/2ML IJ SOLN
INTRAMUSCULAR | Status: DC | PRN
  Administered 2021-08-15: 4 mg via INTRAVENOUS

## 2021-08-15 MED ORDER — HYDROCODONE-ACETAMINOPHEN 10-325 MG PO TABS: 1.0000 | ORAL_TABLET | Freq: Four times a day (QID) | ORAL | 0 refills | Status: DC | PRN

## 2021-08-15 MED ORDER — CEFAZOLIN SODIUM-DEXTROSE 2-4 GM/100ML-% IV SOLN
2.0000 g | INTRAVENOUS | Status: AC
  Administered 2021-08-15: 2 g via INTRAVENOUS
  Filled 2021-08-15: qty 100

## 2021-08-15 MED ORDER — MIDAZOLAM HCL 2 MG/2ML IJ SOLN
INTRAMUSCULAR | Status: AC
  Administered 2021-08-15: 1 mg via INTRAVENOUS
  Filled 2021-08-15: qty 2

## 2021-08-15 MED ORDER — ORAL CARE MOUTH RINSE: 15.0000 mL | Freq: Once | OROMUCOSAL | Status: AC

## 2021-08-15 MED ORDER — LIDOCAINE 2% (20 MG/ML) 5 ML SYRINGE
INTRAMUSCULAR | Status: AC
  Filled 2021-08-15: qty 5

## 2021-08-15 MED ORDER — PROPOFOL 10 MG/ML IV BOLUS
INTRAVENOUS | Status: AC
  Filled 2021-08-15: qty 20

## 2021-08-15 MED ORDER — DROPERIDOL 2.5 MG/ML IJ SOLN: 0.6250 mg | Freq: Once | INTRAMUSCULAR | Status: DC | PRN

## 2021-08-15 MED ORDER — POVIDONE-IODINE 10 % EX SWAB
2.0000 "application " | Freq: Once | CUTANEOUS | Status: AC
  Administered 2021-08-15: 2 via TOPICAL

## 2021-08-15 MED ORDER — EPHEDRINE SULFATE-NACL 50-0.9 MG/10ML-% IV SOSY
PREFILLED_SYRINGE | INTRAVENOUS | Status: DC | PRN
  Administered 2021-08-15: 5 mg via INTRAVENOUS
  Administered 2021-08-15: 10 mg via INTRAVENOUS
  Administered 2021-08-15 (×2): 5 mg via INTRAVENOUS

## 2021-08-15 MED ORDER — PROPOFOL 10 MG/ML IV BOLUS
INTRAVENOUS | Status: DC | PRN
  Administered 2021-08-15: 150 mg via INTRAVENOUS

## 2021-08-15 MED ORDER — FENTANYL CITRATE (PF) 250 MCG/5ML IJ SOLN
INTRAMUSCULAR | Status: AC
  Filled 2021-08-15: qty 5

## 2021-08-15 MED ORDER — METHOCARBAMOL 500 MG PO TABS: 500.0000 mg | ORAL_TABLET | Freq: Three times a day (TID) | ORAL | 0 refills | Status: DC | PRN

## 2021-08-15 SURGICAL SUPPLY — 64 items
BAG COUNTER SPONGE SURGICOUNT (BAG) ×2 IMPLANT
BAG DECANTER FOR FLEXI CONT (MISCELLANEOUS) ×2 IMPLANT
BAG SPNG CNTER NS LX DISP (BAG) ×1
BANDAGE ESMARK 6X9 LF (GAUZE/BANDAGES/DRESSINGS) ×1 IMPLANT
BNDG CMPR 9X6 STRL LF SNTH (GAUZE/BANDAGES/DRESSINGS) ×1
BNDG ELASTIC 6X5.8 VLCR STR LF (GAUZE/BANDAGES/DRESSINGS) ×2 IMPLANT
BNDG ESMARK 6X9 LF (GAUZE/BANDAGES/DRESSINGS) ×2
CANISTER SUCT 3000ML PPV (MISCELLANEOUS) ×2 IMPLANT
CLSR STERI-STRIP ANTIMIC 1/2X4 (GAUZE/BANDAGES/DRESSINGS) ×2 IMPLANT
CUFF TOURN SGL QUICK 34 (TOURNIQUET CUFF) ×2
CUFF TRNQT CYL 34X4.125X (TOURNIQUET CUFF) ×1 IMPLANT
DECANTER SPIKE VIAL GLASS SM (MISCELLANEOUS) IMPLANT
DRAPE OEC MINIVIEW 54X84 (DRAPES) ×1 IMPLANT
DRAPE U-SHAPE 47X51 STRL (DRAPES) ×2 IMPLANT
DRSG ADAPTIC 3X8 NADH LF (GAUZE/BANDAGES/DRESSINGS) ×1 IMPLANT
DRSG PAD ABDOMINAL 8X10 ST (GAUZE/BANDAGES/DRESSINGS) ×1 IMPLANT
DURAPREP 26ML APPLICATOR (WOUND CARE) ×1 IMPLANT
ELECT REM PT RETURN 9FT ADLT (ELECTROSURGICAL) ×2
ELECTRODE REM PT RTRN 9FT ADLT (ELECTROSURGICAL) ×1 IMPLANT
GAUZE SPONGE 4X4 12PLY STRL (GAUZE/BANDAGES/DRESSINGS) ×2 IMPLANT
GAUZE XEROFORM 1X8 LF (GAUZE/BANDAGES/DRESSINGS) ×2 IMPLANT
GLOVE SRG 8 PF TXTR STRL LF DI (GLOVE) ×1 IMPLANT
GLOVE SURG ENC MOIS LTX SZ7.5 (GLOVE) ×2 IMPLANT
GLOVE SURG SYN 7.5  E (GLOVE) ×2
GLOVE SURG SYN 7.5 E (GLOVE) ×1 IMPLANT
GLOVE SURG SYN 7.5 PF PI (GLOVE) ×1 IMPLANT
GLOVE SURG UNDER POLY LF SZ7.5 (GLOVE) ×2 IMPLANT
GLOVE SURG UNDER POLY LF SZ8 (GLOVE) ×2
GOWN STRL REUS W/ TWL LRG LVL3 (GOWN DISPOSABLE) ×3 IMPLANT
GOWN STRL REUS W/ TWL XL LVL3 (GOWN DISPOSABLE) ×2 IMPLANT
GOWN STRL REUS W/TWL LRG LVL3 (GOWN DISPOSABLE) ×6
GOWN STRL REUS W/TWL XL LVL3 (GOWN DISPOSABLE) ×4
IMMOBILIZER KNEE 22 UNIV (SOFTGOODS) ×2 IMPLANT
KIT BASIN OR (CUSTOM PROCEDURE TRAY) ×2 IMPLANT
KIT TURNOVER KIT B (KITS) ×2 IMPLANT
NDL SUT 6 .5 CRC .975X.05 MAYO (NEEDLE) IMPLANT
NEEDLE MAYO TAPER (NEEDLE)
NS IRRIG 1000ML POUR BTL (IV SOLUTION) ×2 IMPLANT
PACK ORTHO EXTREMITY (CUSTOM PROCEDURE TRAY) ×2 IMPLANT
PAD ARMBOARD 7.5X6 YLW CONV (MISCELLANEOUS) ×4 IMPLANT
PADDING CAST COTTON 6X4 STRL (CAST SUPPLIES) ×2 IMPLANT
PENCIL BUTTON HOLSTER BLD 10FT (ELECTRODE) ×2 IMPLANT
SPONGE T-LAP 18X18 ~~LOC~~+RFID (SPONGE) ×2 IMPLANT
STRIP CLOSURE SKIN 1/2X4 (GAUZE/BANDAGES/DRESSINGS) ×1 IMPLANT
SUCTION FRAZIER HANDLE 10FR (MISCELLANEOUS)
SUCTION TUBE FRAZIER 10FR DISP (MISCELLANEOUS) IMPLANT
SUT ETHILON 3 0 PS 1 (SUTURE) IMPLANT
SUT FIBERWIRE #2 38 REV NDL BL (SUTURE) ×2
SUT FIBERWIRE #2 38 T-5 BLUE (SUTURE) ×4
SUT MNCRL AB 3-0 PS2 18 (SUTURE) ×1 IMPLANT
SUT MNCRL AB 4-0 PS2 18 (SUTURE) ×1 IMPLANT
SUT VIC AB 0 CT1 27 (SUTURE)
SUT VIC AB 0 CT1 27XBRD ANBCTR (SUTURE) IMPLANT
SUT VIC AB 2-0 CT1 27 (SUTURE) ×4
SUT VIC AB 2-0 CT1 TAPERPNT 27 (SUTURE) IMPLANT
SUT VIC AB 2-0 SH 27 (SUTURE)
SUT VIC AB 2-0 SH 27XBRD (SUTURE) IMPLANT
SUT VIC AB 3-0 SH 27 (SUTURE)
SUT VIC AB 3-0 SH 27X BRD (SUTURE) IMPLANT
SUTURE FIBERWR #2 38 T-5 BLUE (SUTURE) IMPLANT
SUTURE FIBERWR#2 38 REV NDL BL (SUTURE) IMPLANT
TUBE CONNECTING 20X1/4 (TUBING) ×2 IMPLANT
WRAP KNEE MAXI GEL POST OP (GAUZE/BANDAGES/DRESSINGS) ×1 IMPLANT
YANKAUER SUCT BULB TIP NO VENT (SUCTIONS) IMPLANT

## 2021-08-15 NOTE — Anesthesia Procedure Notes (Signed)
Procedure Name: LMA Insertion Date/Time: 08/15/2021 11:29 AM Performed by: Michele Rockers, CRNA Pre-anesthesia Checklist: Patient identified, Patient being monitored, Timeout performed, Emergency Drugs available and Suction available Patient Re-evaluated:Patient Re-evaluated prior to induction Oxygen Delivery Method: Circle system utilized Preoxygenation: Pre-oxygenation with 100% oxygen Induction Type: IV induction Ventilation: Mask ventilation without difficulty LMA Size: 5.0 Number of attempts: 1 Airway Equipment and Method: Stylet Placement Confirmation: positive ETCO2 and breath sounds checked- equal and bilateral Tube secured with: Tape Dental Injury: Teeth and Oropharynx as per pre-operative assessment

## 2021-08-15 NOTE — Anesthesia Procedure Notes (Signed)
Anesthesia Regional Block: Femoral nerve block   Pre-Anesthetic Checklist: , timeout performed,  Correct Patient, Correct Site, Correct Laterality,  Correct Procedure, Correct Position, site marked,  Risks and benefits discussed,  Surgical consent,  Pre-op evaluation,  At surgeon's request and post-op pain management  Laterality: Left  Prep: chloraprep       Needles:  Injection technique: Single-shot  Needle Type: Echogenic Stimulator Needle     Needle Length: 9cm  Needle Gauge: 21     Additional Needles:   Procedures:,,,, ultrasound used (permanent image in chart),,    Narrative:  Start time: 08/15/2021 10:40 AM End time: 08/15/2021 10:45 AM Injection made incrementally with aspirations every 5 mL.  Performed by: Personally  Anesthesiologist: Merlinda Frederick, MD  Additional Notes: A functioning IV was confirmed and monitors were applied.  Sterile prep and drape, hand hygiene and sterile gloves were used.  Negative aspiration and test dose prior to incremental administration of local anesthetic. The patient tolerated the procedure well.Ultrasound  guidance: relevant anatomy identified, needle position confirmed, local anesthetic spread visualized around nerve(s), vascular puncture avoided.  Image printed for medical record.

## 2021-08-15 NOTE — Interval H&P Note (Signed)
History and Physical Interval Note:  08/15/2021 10:32 AM  Matthew Bradford  has presented today for surgery, with the diagnosis of LEFT QUAD TENDON RUPTURE.  The various methods of treatment have been discussed with the patient and family. After consideration of risks, benefits and other options for treatment, the patient has consented to  Procedure(s): REPAIR QUADRICEP TENDON (Left) as a surgical intervention.  The patient's history has been reviewed, patient examined, no change in status, stable for surgery.  I have reviewed the patient's chart and labs.  Questions were answered to the patient's satisfaction.     Renette Butters

## 2021-08-15 NOTE — Transfer of Care (Signed)
Immediate Anesthesia Transfer of Care Note  Patient: Matthew Bradford  Procedure(s) Performed: REPAIR QUADRICEP TENDON (Left)  Patient Location: PACU  Anesthesia Type:General  Level of Consciousness: drowsy and patient cooperative  Airway & Oxygen Therapy: Patient Spontanous Breathing and Patient connected to nasal cannula oxygen  Post-op Assessment: Report given to RN and Post -op Vital signs reviewed and stable  Post vital signs: Reviewed and stable  Last Vitals:  Vitals Value Taken Time  BP    Temp    Pulse    Resp    SpO2      Last Pain:  Vitals:   08/15/21 1021  TempSrc:   PainSc: 0-No pain         Complications: No notable events documented.

## 2021-08-15 NOTE — Interval H&P Note (Signed)
History and Physical Interval Note:  08/15/2021 8:51 AM  Matthew Bradford  has presented today for surgery, with the diagnosis of LEFT QUAD TENDON RUPTURE.  The various methods of treatment have been discussed with the patient and family. After consideration of risks, benefits and other options for treatment, the patient has consented to  Procedure(s): REPAIR QUADRICEP TENDON (Left) as a surgical intervention.  The patient's history has been reviewed, patient examined, no change in status, stable for surgery.  I have reviewed the patient's chart and labs.  Questions were answered to the patient's satisfaction.     Renette Butters

## 2021-08-15 NOTE — Anesthesia Preprocedure Evaluation (Addendum)
Anesthesia Evaluation  Patient identified by MRN, date of birth, ID band Patient awake    Reviewed: Allergy & Precautions, H&P , NPO status , Patient's Chart, lab work & pertinent test results  Airway Mallampati: III  TM Distance: >3 FB Neck ROM: Full    Dental no notable dental hx.    Pulmonary neg pulmonary ROS, former smoker,    Pulmonary exam normal breath sounds clear to auscultation       Cardiovascular negative cardio ROS Normal cardiovascular exam Rhythm:Regular Rate:Normal     Neuro/Psych negative neurological ROS  negative psych ROS   GI/Hepatic negative GI ROS, Neg liver ROS,   Endo/Other  negative endocrine ROS  Renal/GU Renal disease (kidney stones)  negative genitourinary   Musculoskeletal negative musculoskeletal ROS (+)   Abdominal   Peds negative pediatric ROS (+)  Hematology  (+) anemia ,   Anesthesia Other Findings Worker's comp injury  Reproductive/Obstetrics negative OB ROS                            Anesthesia Physical Anesthesia Plan  ASA: 2  Anesthesia Plan: Regional and General   Post-op Pain Management:    Induction: Intravenous  PONV Risk Score and Plan: 2 and Treatment may vary due to age or medical condition, Midazolam, Ondansetron and Dexamethasone  Airway Management Planned: LMA  Additional Equipment:   Intra-op Plan:   Post-operative Plan: Extubation in OR  Informed Consent: I have reviewed the patients History and Physical, chart, labs and discussed the procedure including the risks, benefits and alternatives for the proposed anesthesia with the patient or authorized representative who has indicated his/her understanding and acceptance.     Dental advisory given  Plan Discussed with: CRNA, Anesthesiologist and Surgeon  Anesthesia Plan Comments: (Femoral block for pain control as requested by surgeon. GA/LMA. Norton Blizzard, MD  )        Anesthesia Quick Evaluation

## 2021-08-15 NOTE — Discharge Instructions (Addendum)

## 2021-08-15 NOTE — Op Note (Signed)
08/15/2021  10:37 AM  PATIENT:  Matthew Bradford    PRE-OPERATIVE DIAGNOSIS:  LEFT QUAD TENDON RUPTURE  POST-OPERATIVE DIAGNOSIS:  Same  PROCEDURE:  REPAIR QUADRICEP TENDON  SURGEON:  Renette Butters, MD  ASSISTANT: Aggie Moats, PA-C, he was present and scrubbed throughout the case, critical for completion in a timely fashion, and for retraction, instrumentation, and closure.   ANESTHESIA:   gen/block  PREOPERATIVE INDICATIONS:  [name] is a  60 y.o. male with a diagnosis of LEFT QUAD TENDON RUPTURE who elected for surgical management.    The risks benefits and alternatives were discussed with the patient preoperatively including but not limited to the risks of infection, bleeding, nerve injury, cardiopulmonary complications, the need for revision surgery, among others, and the patient was willing to proceed.  OPERATIVE FINDINGS: complete rupture  BLOOD LOSS: min  TOURNIQUET TIME: 36min  OPERATIVE PROCEDURE:  Patient was identified in the preoperative holding area and site was marked by me He was transported to the operating theater and placed on the table in supine position taking care to pad all bony prominences. After a preincinduction time out anesthesia was induced. The left lower extremity was prepped and draped in normal sterile fashion and a pre-incision timeout was performed. He received ancef for preoperative antibiotics.   I made an incision directly over his traumatic injury. I dissected down to the level of the peritenon and elevated skin flaps over top of this there were full-thickness.  I then incised the peritenon it was partially ruptured as well. Identified his rupture tendon.  I debrided tendon from the patella allowing a good bony bed for healing.  I then used 2 #2 FiberWire as a whipstitched up and back in the Tendon leaving me with 4 total strands coming out.  I thoroughly irrigated the joint  Next I used a drill bit to drill 3 holes in the  patella taking care to not penetrate the articular surface. I used a [place] suture passer to pass all 4 stitches through these holes.  The patella reapproximated well to the tendon. I tied the stitches over top of the bone bridge of the patella.  I then stressed the repair and it was stable to 40 degrees.  I then thoroughly irrigated the wound again.  I repaired the medial collateral capsul  I then repaired the lateral collateral capsul  I closed the ruptured peritenon as well as the surgically incised peritenon with an 0 Vicryl. Then closed the skin with a monocryl stitch  Sterile dressing was applied the knee was placed in a knee immobilizer and taken the PACU in stable condition.  POST OPERATIVE PLAN: WBAT in immobilizer, DVT px: early ambulation and ASA

## 2021-08-16 ENCOUNTER — Encounter (HOSPITAL_COMMUNITY): Payer: Self-pay | Admitting: Orthopedic Surgery

## 2021-08-17 NOTE — Anesthesia Postprocedure Evaluation (Signed)
Anesthesia Post Note  Patient: Matthew Bradford  Procedure(s) Performed: REPAIR QUADRICEP TENDON (Left)     Patient location during evaluation: PACU Anesthesia Type: Regional and General Level of consciousness: awake Pain management: pain level controlled Vital Signs Assessment: post-procedure vital signs reviewed and stable Respiratory status: spontaneous breathing and respiratory function stable Cardiovascular status: stable Postop Assessment: no apparent nausea or vomiting Anesthetic complications: no   No notable events documented.  Last Vitals:  Vitals:   08/15/21 1320 08/15/21 1335  BP: 125/66 127/75  Pulse: 61 74  Resp: 14 18  Temp:  (!) 36.1 C  SpO2: 98% 94%    Last Pain:  Vitals:   08/15/21 1335  TempSrc:   PainSc: 0-No pain                 Merlinda Frederick

## 2021-08-28 NOTE — ED Notes (Signed)
Pt contacted due to missing signature for Matthew Bradford equipment. Pt states that his visit should be filed as workers comp and his case worker is Midwife, phone number [phone], case number [id number]. Pt states he is unable to drive to the urgent care and asked nurse to initial missing signature for patient agreement. Pt states he gives this nurse permission to do so and states he does not have an e-mail address on file for a copy of agreement to be sent to.

## 2022-01-17 ENCOUNTER — Other Ambulatory Visit: Payer: Self-pay | Admitting: Pain Medicine

## 2022-01-17 DIAGNOSIS — M5416 Radiculopathy, lumbar region: Secondary | ICD-10-CM

## 2022-10-25 ENCOUNTER — Emergency Department (HOSPITAL_BASED_OUTPATIENT_CLINIC_OR_DEPARTMENT_OTHER): Payer: PRIVATE HEALTH INSURANCE

## 2022-10-25 ENCOUNTER — Emergency Department (HOSPITAL_BASED_OUTPATIENT_CLINIC_OR_DEPARTMENT_OTHER)
Admission: EM | Admit: 2022-10-25 | Discharge: 2022-10-25 | Disposition: A | Discharge status: Home / Self Care | Payer: PRIVATE HEALTH INSURANCE | Attending: Emergency Medicine | Admitting: Emergency Medicine

## 2022-10-25 ENCOUNTER — Other Ambulatory Visit: Payer: Self-pay

## 2022-10-25 ENCOUNTER — Encounter (HOSPITAL_BASED_OUTPATIENT_CLINIC_OR_DEPARTMENT_OTHER): Payer: Self-pay

## 2022-10-25 DIAGNOSIS — Z1152 Encounter for screening for COVID-19: Secondary | ICD-10-CM | POA: Insufficient documentation

## 2022-10-25 DIAGNOSIS — K529 Noninfective gastroenteritis and colitis, unspecified: Secondary | ICD-10-CM | POA: Insufficient documentation

## 2022-10-25 DIAGNOSIS — R34 Anuria and oliguria: Secondary | ICD-10-CM | POA: Insufficient documentation

## 2022-10-25 DIAGNOSIS — R509 Fever, unspecified: Secondary | ICD-10-CM | POA: Diagnosis present

## 2022-10-25 DIAGNOSIS — Z7982 Long term (current) use of aspirin: Secondary | ICD-10-CM | POA: Insufficient documentation

## 2022-10-25 LAB — CBC WITH DIFFERENTIAL/PLATELET
Abs Immature Granulocytes: 0.02 10*3/uL (ref 0.00–0.07)
Basophils Absolute: 0 10*3/uL (ref 0.0–0.1)
Basophils Relative: 0 %
Eosinophils Absolute: 0 10*3/uL (ref 0.0–0.5)
Eosinophils Relative: 1 %
HCT: 44.4 % (ref 39.0–52.0)
Hemoglobin: 16.5 g/dL (ref 13.0–17.0)
Immature Granulocytes: 0 %
Lymphocytes Relative: 10 %
Lymphs Abs: 0.8 10*3/uL (ref 0.7–4.0)
MCH: 32.7 pg (ref 26.0–34.0)
MCHC: 37.2 g/dL — ABNORMAL HIGH (ref 30.0–36.0)
MCV: 88.1 fL (ref 80.0–100.0)
Monocytes Absolute: 0.7 10*3/uL (ref 0.1–1.0)
Monocytes Relative: 9 %
Neutro Abs: 6.3 10*3/uL (ref 1.7–7.7)
Neutrophils Relative %: 80 %
Platelets: 165 10*3/uL (ref 150–400)
RBC: 5.04 MIL/uL (ref 4.22–5.81)
RDW: 11.9 % (ref 11.5–15.5)
WBC: 7.8 10*3/uL (ref 4.0–10.5)
nRBC: 0 % (ref 0.0–0.2)

## 2022-10-25 LAB — URINALYSIS, ROUTINE W REFLEX MICROSCOPIC
Bilirubin Urine: NEGATIVE
Cellular Cast, UA: 3
Glucose, UA: NEGATIVE mg/dL
Ketones, ur: NEGATIVE mg/dL
Leukocytes,Ua: NEGATIVE
Nitrite: NEGATIVE
Protein, ur: 30 mg/dL — AB
RBC / HPF: >50 RBC/hpf (ref 0–5)
Specific Gravity, Urine: 1.032 — ABNORMAL HIGH (ref 1.005–1.030)
pH: 5.5 (ref 5.0–8.0)

## 2022-10-25 LAB — COMPREHENSIVE METABOLIC PANEL
ALT: 18 U/L (ref 0–44)
AST: 17 U/L (ref 15–41)
Albumin: 4.2 g/dL (ref 3.5–5.0)
Alkaline Phosphatase: 65 U/L (ref 38–126)
Anion gap: 11 (ref 5–15)
BUN: 13 mg/dL (ref 8–23)
CO2: 23 mmol/L (ref 22–32)
Calcium: 9.4 mg/dL (ref 8.9–10.3)
Chloride: 100 mmol/L (ref 98–111)
Creatinine, Ser: 1.3 mg/dL — ABNORMAL HIGH (ref 0.61–1.24)
GFR, Estimated: >60 mL/min (ref >60)
Glucose, Bld: 152 mg/dL — ABNORMAL HIGH (ref 70–99)
Potassium: 3.5 mmol/L (ref 3.5–5.1)
Sodium: 134 mmol/L — ABNORMAL LOW (ref 135–145)
Total Bilirubin: 1 mg/dL (ref 0.3–1.2)
Total Protein: 7.2 g/dL (ref 6.5–8.1)

## 2022-10-25 LAB — RESP PANEL BY RT-PCR (RSV, FLU A&B, COVID)  RVPGX2
Influenza A by PCR: NEGATIVE
Influenza B by PCR: NEGATIVE
Resp Syncytial Virus by PCR: NEGATIVE
SARS Coronavirus 2 by RT PCR: NEGATIVE

## 2022-10-25 MED ORDER — CIPROFLOXACIN HCL 500 MG PO TABS: 500.0000 mg | ORAL_TABLET | Freq: Two times a day (BID) | ORAL | 0 refills | Status: DC

## 2022-10-25 MED ORDER — ACETAMINOPHEN 500 MG PO TABS
1000.0000 mg | ORAL_TABLET | Freq: Once | ORAL | Status: AC
  Administered 2022-10-25: 1000 mg via ORAL
  Filled 2022-10-25: qty 2

## 2022-10-25 MED ORDER — CIPROFLOXACIN HCL 500 MG PO TABS
500.0000 mg | ORAL_TABLET | Freq: Once | ORAL | Status: AC
  Administered 2022-10-25: 500 mg via ORAL
  Filled 2022-10-25: qty 1

## 2022-10-25 MED ORDER — METRONIDAZOLE 500 MG PO TABS
500.0000 mg | ORAL_TABLET | Freq: Once | ORAL | Status: AC
  Administered 2022-10-25: 500 mg via ORAL
  Filled 2022-10-25: qty 1

## 2022-10-25 MED ORDER — LOPERAMIDE HCL 2 MG PO CAPS
4.0000 mg | ORAL_CAPSULE | Freq: Once | ORAL | Status: AC
  Administered 2022-10-25: 4 mg via ORAL
  Filled 2022-10-25: qty 2

## 2022-10-25 MED ORDER — SODIUM CHLORIDE 0.9 % IV BOLUS
1000.0000 mL | Freq: Once | INTRAVENOUS | Status: AC
  Administered 2022-10-25: 1000 mL via INTRAVENOUS

## 2022-10-25 MED ORDER — METRONIDAZOLE 500 MG PO TABS: 500.0000 mg | ORAL_TABLET | Freq: Two times a day (BID) | ORAL | 0 refills | Status: DC

## 2022-10-25 NOTE — ED Provider Notes (Addendum)
[facility] Provider Note   CSN: [id number] Arrival date & time: 10/25/22  1934     History  Chief Complaint  Patient presents with   Fever    Matthew Bradford is a 62 y.o. male.  Patient is a 62 year old male who presents with fever and bodyaches.  Said they started 2 days ago.  He is also had some profuse diarrhea.  He has had some nausea but no vomiting.  He has some cramping in his abdomen before the diarrhea but no other abdominal pain.  No runny nose congestion or coughing.  No shortness of breath.  He says his urine is decreased.  He does not think that he is urinated since Tuesday.  He feels like he is just having diarrhea but has not noticed any urine.  Denies any pain or burning on urination.       Home Medications Prior to Admission medications   Medication Sig Start Date End Date Taking? Authorizing Provider  ciprofloxacin (CIPRO) 500 MG tablet Take 1 tablet (500 mg total) by mouth every 12 (twelve) hours. 10/25/22  Yes Malvin Johns, MD  metroNIDAZOLE (FLAGYL) 500 MG tablet Take 1 tablet (500 mg total) by mouth 2 (two) times daily. 10/25/22  Yes Malvin Johns, MD  acetaminophen (TYLENOL) 500 MG tablet Take 2 tablets (1,000 mg total) by mouth every 8 (eight) hours as needed for moderate pain. 08/15/21   Britt Bottom, PA-C  aspirin EC 81 MG tablet Take 1 tablet (81 mg total) by mouth daily. To prevent blood clots for 30 days after surgery. 08/15/21   Britt Bottom, PA-C  HYDROcodone-acetaminophen (NORCO) 10-325 MG tablet Take 1 tablet by mouth every 6 (six) hours as needed. 08/15/21   Britt Bottom, PA-C  methocarbamol (ROBAXIN) 500 MG tablet Take 1 tablet (500 mg total) by mouth every 8 (eight) hours as needed for muscle spasms. 08/15/21   Britt Bottom, PA-C  ondansetron (ZOFRAN-ODT) 4 MG disintegrating tablet Take 1 tablet (4 mg total) by mouth every 8 (eight) hours as needed for nausea or vomiting. 08/15/21   Britt Bottom, PA-C      Allergies    Ibuprofen, Nsaids, and Oxycodone    Review of Systems   Review of Systems  Constitutional:  Positive for chills, fatigue and fever. Negative for diaphoresis.  HENT:  Negative for congestion, rhinorrhea and sneezing.   Eyes: Negative.   Respiratory:  Negative for cough, chest tightness and shortness of breath.   Cardiovascular:  Negative for chest pain and leg swelling.  Gastrointestinal:  Positive for diarrhea and nausea. Negative for abdominal pain, blood in stool and vomiting.  Genitourinary:  Positive for decreased urine volume. Negative for difficulty urinating, flank pain, frequency and hematuria.  Musculoskeletal:  Positive for myalgias. Negative for arthralgias and back pain.  Skin:  Negative for rash.  Neurological:  Negative for dizziness, speech difficulty, weakness, numbness and headaches.    Physical Exam Updated Vital Signs BP 108/79   Pulse 93   Temp 98.6 F (37 C) (Oral)   Resp 20   Ht '5\' 10"'$  (1.778 m)   Wt 81.6 kg   SpO2 96%   BMI 25.81 kg/m  Physical Exam Constitutional:      Appearance: He is well-developed.  HENT:     Head: Normocephalic and atraumatic.  Eyes:     Pupils: Pupils are equal, round, and reactive to light.  Cardiovascular:     Rate and  Rhythm: Regular rhythm. Tachycardia present.     Heart sounds: Normal heart sounds.  Pulmonary:     Effort: Pulmonary effort is normal. No respiratory distress.     Breath sounds: Normal breath sounds. No wheezing or rales.  Chest:     Chest wall: No tenderness.  Abdominal:     General: Bowel sounds are normal.     Palpations: Abdomen is soft.     Tenderness: There is no abdominal tenderness. There is no guarding or rebound.  Musculoskeletal:        General: Normal range of motion.     Cervical back: Normal range of motion and neck supple.  Lymphadenopathy:     Cervical: No cervical adenopathy.  Skin:    General: Skin is warm and dry.     Findings: No rash.   Neurological:     Mental Status: He is alert and oriented to person, place, and time.     ED Results / Procedures / Treatments   Labs (all labs ordered are listed, but only abnormal results are displayed) Labs Reviewed  COMPREHENSIVE METABOLIC PANEL - Abnormal; Notable for the following components:      Result Value   Sodium 134 (*)    Glucose, Bld 152 (*)    Creatinine, Ser 1.30 (*)    All other components within normal limits  CBC WITH DIFFERENTIAL/PLATELET - Abnormal; Notable for the following components:   MCHC 37.2 (*)    All other components within normal limits  URINALYSIS, ROUTINE W REFLEX MICROSCOPIC - Abnormal; Notable for the following components:   Specific Gravity, Urine 1.032 (*)    Hgb urine dipstick LARGE (*)    Protein, ur 30 (*)    Bacteria, UA RARE (*)    All other components within normal limits  RESP PANEL BY RT-PCR (RSV, FLU A&B, COVID)  RVPGX2    EKG None  Radiology CT ABDOMEN PELVIS WO CONTRAST  Result Date: 10/25/2022 CLINICAL DATA:  Chills with progressive body aches and soreness. Diarrhea and mild nausea. Abdominal pain EXAM: CT ABDOMEN AND PELVIS WITHOUT CONTRAST TECHNIQUE: Multidetector CT imaging of the abdomen and pelvis was performed following the standard protocol without IV contrast. RADIATION DOSE REDUCTION: This exam was performed according to the departmental dose-optimization program which includes automated exposure control, adjustment of the mA and/or kV according to patient size and/or use of iterative reconstruction technique. COMPARISON:  12/12/2020 FINDINGS: Lower chest: Small pericardial effusion.  Unchanged from prior. Hepatobiliary: No focal liver abnormality is seen. No gallstones, gallbladder wall thickening, or biliary dilatation. Pancreas: Unremarkable. Spleen: Unremarkable. Adrenals/Urinary Tract: Normal adrenal glands. Nonobstructing left nephrolithiasis. Unremarkable bladder. Stomach/Bowel: Submucosal fat deposition in the right  colon compatible with sequela of chronic inflammation. There is associated mild stranding about the right colon. Fat deposition within the wall of the appendix which is nondilated. Unremarkable stomach. Multifocal segments of distended loops of jejunum in the left hemiabdomen measuring up to 33 mm in diameter. There is no definite transition point. Fat deposition within the wall of the terminal ileum. Vascular/Lymphatic: Aortic atherosclerotic calcification. No suspicious lymphadenopathy. Reproductive: Unremarkable. Other: Small amount of free fluid in the right pericolic gutter. No free intraperitoneal air. Musculoskeletal: No acute or significant osseous findings. IMPRESSION: Acute on chronic right colitis. Consider inflammatory bowel disease given intramural fat deposition in the right colon and terminal ileum. Multiple segments of dilated jejunum without definite transition point. Findings are favored to represent ileus. Nonobstructing left nephrolithiasis. Electronically Signed   By: Placido Sou  M.D.   On: 10/25/2022 22:48    Procedures Procedures    Medications Ordered in ED Medications  acetaminophen (TYLENOL) tablet 1,000 mg (1,000 mg Oral Given 10/25/22 1945)  sodium chloride 0.9 % bolus 1,000 mL (0 mLs Intravenous Stopped 10/25/22 2150)  loperamide (IMODIUM) capsule 4 mg (4 mg Oral Given 10/25/22 2324)  ciprofloxacin (CIPRO) tablet 500 mg (500 mg Oral Given 10/25/22 2324)  metroNIDAZOLE (FLAGYL) tablet 500 mg (500 mg Oral Given 10/25/22 2324)    ED Course/ Medical Decision Making/ A&P                             Medical Decision Making Amount and/or Complexity of Data Reviewed Labs: ordered. Radiology: ordered.  Risk OTC drugs. Prescription drug management.   Patient is a 62 year old male who presents with fever and bodyaches with profuse diarrhea.  His viral panel is negative.  His labs show an elevated creatinine but based on chart review it is similar to prior values.  His white  count is normal.  He has minimal abdominal pain.  However given his symptoms, CT scan was performed.  This shows evidence of colitis which appears to be acute on chronic and possibly concerning for inflammatory bowel disease.  He does not really report any ongoing bowel issues.  It is hard to know whether this is viral or bacterial although given the CT findings, will err on the side of treating him with antibiotics.  He is overall well-appearing.  He does not have any ongoing abdominal pain.  His abdominal exam is benign.  There is a question of an ileus but he does not have any vomiting.  He is able to tolerate fluids without vomiting.  His urine does not have any suggestions of infection.  He does not have any cough or cold type symptoms that would be more concerning for pneumonia.  I offered him admission given that he is having multiple episodes of watery diarrhea but he declined saying he wants to go home.  Will start him on antibiotics and he was given dose of Imodium here in the ED.  He had previously been given IV fluids.  He was discharged home in good condition.  He was encouraged to have close follow-up with his primary care doctor.  He was advised a CT showed some possibly acute on chronic findings and that he may need to see a gastroenterologist.  He can discuss this with his primary care doctor.  Strict return precautions were given.  Final Clinical Impression(s) / ED Diagnoses Final diagnoses:  Colitis    Rx / DC Orders ED Discharge Orders          Ordered    ciprofloxacin (CIPRO) 500 MG tablet  Every 12 hours        10/25/22 2322    metroNIDAZOLE (FLAGYL) 500 MG tablet  2 times daily        10/25/22 2322              Malvin Johns, MD 10/25/22 8502    Malvin Johns, MD 10/25/22 [phone]

## 2022-10-25 NOTE — Discharge Instructions (Addendum)
Make an appointment to have close follow-up with your primary care doctor to assess for improvement.  You had some abnormal changes on your CT scan and may need follow-up by a gastroenterologist.  Return to the emergency room if you have any worsening symptoms.

## 2022-10-25 NOTE — ED Notes (Signed)
Pt to CT scan via wheelchair.

## 2022-10-25 NOTE — ED Triage Notes (Signed)
Patient here POV from Home.  Endorses Chills that began a few days ago that has progressed into Body Aches/Soreness, Diarrhea, Aches, Mild nausea.   No Cough.   NAD Noted during Triage. A&Ox4. GCS 15. Ambulatory.

## 2023-05-05 NOTE — Progress Notes (Signed)
H&P  Chief Complaint: History of stones, lower urinary tract symptoms, recently passed stones  History of Present Illness: 62 year old male presents with a 1 month history of urinary frequency, urgency, lower GI symptoms, perineal discomfort.  History of kidney stones.  Passed 2 small stones within the past week.  No gross hematuria, nausea, vomiting, no abdominal pain.  Is a patient at [facility] urology in [place].  Has had multiple interventions for kidney stones.  Past Medical History:  Diagnosis Date   Bladder stone    Frequency of urination    Hematuria    History of cellulitis    03-2016  finger   History of kidney stones    Urgency of urination    Wears glasses     Past Surgical History:  Procedure Laterality Date   COLONOSCOPY N/A 06/09/2020   Procedure: COLONOSCOPY;  Surgeon: Malissa Hippo, MD;  Location: [facility];  Service: Endoscopy;  Laterality: N/A;  930   CYSTOSCOPY WITH LITHOLAPAXY N/A 09/07/2016   Procedure: CYSTOSCOPY WITH LITHOLAPAXY;  Surgeon: Marcine Matar, MD;  Location: [facility];  Service: Urology;  Laterality: N/A;   CYSTOSCOPY WITH RETROGRADE PYELOGRAM, URETEROSCOPY AND STENT PLACEMENT Left 12/16/2020   Procedure: CYSTOSCOPY WITH RETROGRADE PYELOGRAM, URETEROSCOPY AND STENT PLACEMENT;  Surgeon: Noel Christmas, MD;  Location: [facility];  Service: Urology;  Laterality: Left;  45 MINS   CYSTOSCOPY/RETROGRADE/URETEROSCOPY/STONE EXTRACTION WITH BASKET  1996   EXTRACORPOREAL SHOCK WAVE LITHOTRIPSY  11/29/2014   EXTRACORPOREAL SHOCK WAVE LITHOTRIPSY Left 05/15/2018   Procedure: LEFT EXTRACORPOREAL SHOCK WAVE LITHOTRIPSY (ESWL);  Surgeon: Crist Fat, MD;  Location: [facility];  Service: Urology;  Laterality: Left;   HOLMIUM LASER APPLICATION N/A 09/07/2016   Procedure: HOLMIUM LASER APPLICATION;  Surgeon: Marcine Matar, MD;  [facility];  Service: Urology;  Laterality: N/A;   HOLMIUM LASER APPLICATION Left  12/16/2020   Procedure: HOLMIUM LASER APPLICATION;  Surgeon: Noel Christmas, MD;  Location: [facility];  Service: Urology;  Laterality: Left;   I & D EXTREMITY Left 05/09/2016   Procedure: IRRIGATION AND DEBRIDEMENT LEFT INDEX FINGER;  Surgeon: Tarry Kos, MD;  Location: [facility];  Service: Orthopedics;  Laterality: Left;   POLYPECTOMY  06/09/2020   Procedure: POLYPECTOMY;  Surgeon: Malissa Hippo, MD;  Location: [facility];  Service: Endoscopy;;   QUADRICEPS TENDON REPAIR Left 08/15/2021   Procedure: REPAIR QUADRICEP TENDON;  Surgeon: Sheral Apley, MD;  Location: [facility];  Service: Orthopedics;  Laterality: Left;   TONSILLECTOMY  1979    Home Medications:  Allergies as of 05/06/2023       Reactions   Ibuprofen Other (See Comments)   "feels like gas build up in abdomen"   Nsaids    "feels like gas build up in abdomen"   Oxycodone    Nightmares, insomnia         Medication List        Accurate as of May 05, 2023  8:11 PM. If you have any questions, ask your nurse or doctor.          acetaminophen 500 MG tablet Commonly known as: TYLENOL Take 2 tablets (1,000 mg total) by mouth every 8 (eight) hours as needed for moderate pain.   aspirin EC 81 MG tablet Take 1 tablet (81 mg total) by mouth daily. To prevent blood clots for 30 days after surgery.   ciprofloxacin 500 MG tablet Commonly known as: CIPRO Take 1 tablet (500  mg total) by mouth every 12 (twelve) hours.   HYDROcodone-acetaminophen 10-325 MG tablet Commonly known as: Norco Take 1 tablet by mouth every 6 (six) hours as needed.   methocarbamol 500 MG tablet Commonly known as: Robaxin Take 1 tablet (500 mg total) by mouth every 8 (eight) hours as needed for muscle spasms.   metroNIDAZOLE 500 MG tablet Commonly known as: FLAGYL Take 1 tablet (500 mg total) by mouth 2 (two) times daily.   ondansetron 4 MG disintegrating tablet Commonly known as: ZOFRAN-ODT Take 1 tablet (4 mg total)  by mouth every 8 (eight) hours as needed for nausea or vomiting.        Allergies:  Allergies  Allergen Reactions   Ibuprofen Other (See Comments)    "feels like gas build up in abdomen"   Nsaids     "feels like gas build up in abdomen"   Oxycodone     Nightmares, insomnia     No family history on file.  Social History:  reports that he quit smoking about 13 years ago. His smoking use included cigarettes. He started smoking about 43 years ago. His smokeless tobacco use includes chew. He reports that he does not drink alcohol and does not use drugs.  ROS: A complete review of systems was performed.  All systems are negative except for pertinent findings as noted.  Physical Exam:  Vital signs in last 24 hours: There were no vitals taken for this visit. Constitutional:  Alert and oriented, No acute distress Cardiovascular: Regular rate  Respiratory: Normal respiratory effort GI: Abdomen is soft, minimal left lower quadrant tenderness.  No rebound or guarding.  No CVA tenderness. Lymphatic: No lymphadenopathy Neurologic: Grossly intact, no focal deficits Psychiatric: Normal mood and affect  I have reviewed prior pt notes from [facility]  I have reviewed urinalysis results  I have independently reviewed prior imaging--CT scan images from early 2024 reviewed with the patient  I have reviewed prior stone analysis results-he does have history of uric acid urolithiasis     Impression/Assessment:  Probable ureteral stone-she does have a history of uric acid urolithiasis  Plan:  I will send him out for CT stone protocol.  We will call with results and further follow-up.

## 2023-05-05 NOTE — H&P (View-Only) (Signed)
H&P  Chief Complaint: History of stones, lower urinary tract symptoms, recently passed stones  History of Present Illness: 62 year old male presents with a 1 month history of urinary frequency, urgency, lower GI symptoms, perineal discomfort.  History of kidney stones.  Passed 2 small stones within the past week.  No gross hematuria, nausea, vomiting, no abdominal pain.  Is a patient at [facility] urology in [place].  Has had multiple interventions for kidney stones.  Past Medical History:  Diagnosis Date   Bladder stone    Frequency of urination    Hematuria    History of cellulitis    03-2016  finger   History of kidney stones    Urgency of urination    Wears glasses     Past Surgical History:  Procedure Laterality Date   COLONOSCOPY N/A 06/09/2020   Procedure: COLONOSCOPY;  Surgeon: Malissa Hippo, MD;  [facility];  Service: Endoscopy;  Laterality: N/A;  930   CYSTOSCOPY WITH LITHOLAPAXY N/A 09/07/2016   Procedure: CYSTOSCOPY WITH LITHOLAPAXY;  Surgeon: Marcine Matar, MD;  Location: [facility];  Service: Urology;  Laterality: N/A;   CYSTOSCOPY WITH RETROGRADE PYELOGRAM, URETEROSCOPY AND STENT PLACEMENT Left 12/16/2020   Procedure: CYSTOSCOPY WITH RETROGRADE PYELOGRAM, URETEROSCOPY AND STENT PLACEMENT;  Surgeon: Noel Christmas, MD;  Location: [facility];  Service: Urology;  Laterality: Left;  45 MINS   CYSTOSCOPY/RETROGRADE/URETEROSCOPY/STONE EXTRACTION WITH BASKET  1996   EXTRACORPOREAL SHOCK WAVE LITHOTRIPSY  11/29/2014   EXTRACORPOREAL SHOCK WAVE LITHOTRIPSY Left 05/15/2018   Procedure: LEFT EXTRACORPOREAL SHOCK WAVE LITHOTRIPSY (ESWL);  Surgeon: Crist Fat, MD;  Location: [facility];  Service: Urology;  Laterality: Left;   HOLMIUM LASER APPLICATION N/A 09/07/2016   Procedure: HOLMIUM LASER APPLICATION;  Surgeon: Marcine Matar, MD;  Location: [facility];  Service: Urology;  Laterality: N/A;   HOLMIUM LASER APPLICATION Left  12/16/2020   Procedure: HOLMIUM LASER APPLICATION;  Surgeon: Noel Christmas, MD;  Location: [facility];  Service: Urology;  Laterality: Left;   I & D EXTREMITY Left 05/09/2016   Procedure: IRRIGATION AND DEBRIDEMENT LEFT INDEX FINGER;  Surgeon: Tarry Kos, MD;  Location: [facility];  Service: Orthopedics;  Laterality: Left;   POLYPECTOMY  06/09/2020   Procedure: POLYPECTOMY;  Surgeon: Malissa Hippo, MD;  [facility];  Service: Endoscopy;;   QUADRICEPS TENDON REPAIR Left 08/15/2021   Procedure: REPAIR QUADRICEP TENDON;  Surgeon: Sheral Apley, MD;  Location[facility];  Service: Orthopedics;  Laterality: Left;   TONSILLECTOMY  1979    Home Medications:  Allergies as of 05/06/2023       Reactions   Ibuprofen Other (See Comments)   "feels like gas build up in abdomen"   Nsaids    "feels like gas build up in abdomen"   Oxycodone    Nightmares, insomnia         Medication List        Accurate as of May 05, 2023  8:11 PM. If you have any questions, ask your nurse or doctor.          acetaminophen 500 MG tablet Commonly known as: TYLENOL Take 2 tablets (1,000 mg total) by mouth every 8 (eight) hours as needed for moderate pain.   aspirin EC 81 MG tablet Take 1 tablet (81 mg total) by mouth daily. To prevent blood clots for 30 days after surgery.   ciprofloxacin 500 MG tablet Commonly known as: CIPRO Take 1 tablet (500  mg total) by mouth every 12 (twelve) hours.   HYDROcodone-acetaminophen 10-325 MG tablet Commonly known as: Norco Take 1 tablet by mouth every 6 (six) hours as needed.   methocarbamol 500 MG tablet Commonly known as: Robaxin Take 1 tablet (500 mg total) by mouth every 8 (eight) hours as needed for muscle spasms.   metroNIDAZOLE 500 MG tablet Commonly known as: FLAGYL Take 1 tablet (500 mg total) by mouth 2 (two) times daily.   ondansetron 4 MG disintegrating tablet Commonly known as: ZOFRAN-ODT Take 1 tablet (4 mg total)  by mouth every 8 (eight) hours as needed for nausea or vomiting.        Allergies:  Allergies  Allergen Reactions   Ibuprofen Other (See Comments)    "feels like gas build up in abdomen"   Nsaids     "feels like gas build up in abdomen"   Oxycodone     Nightmares, insomnia     No family history on file.  Social History:  reports that he quit smoking about 13 years ago. His smoking use included cigarettes. He started smoking about 43 years ago. His smokeless tobacco use includes chew. He reports that he does not drink alcohol and does not use drugs.  ROS: A complete review of systems was performed.  All systems are negative except for pertinent findings as noted.  Physical Exam:  Vital signs in last 24 hours: There were no vitals taken for this visit. Constitutional:  Alert and oriented, No acute distress Cardiovascular: Regular rate  Respiratory: Normal respiratory effort GI: Abdomen is soft, minimal left lower quadrant tenderness.  No rebound or guarding.  No CVA tenderness. Lymphatic: No lymphadenopathy Neurologic: Grossly intact, no focal deficits Psychiatric: Normal mood and affect  I have reviewed prior pt notes from [facility]  I have reviewed urinalysis results  I have independently reviewed prior imaging--CT scan images from early 2024 reviewed with the patient  I have reviewed prior stone analysis results-he does have history of uric acid urolithiasis     Impression/Assessment:  Probable ureteral stone-she does have a history of uric acid urolithiasis  Plan:  I will send him out for CT stone protocol.  We will call with results and further follow-up.

## 2023-05-06 ENCOUNTER — Ambulatory Visit (HOSPITAL_COMMUNITY)
Admission: RE | Admit: 2023-05-06 | Discharge: 2023-05-06 | Disposition: A | Discharge status: Home / Self Care | Payer: PRIVATE HEALTH INSURANCE | Source: Ambulatory Visit | Attending: Urology | Admitting: Urology

## 2023-05-06 ENCOUNTER — Ambulatory Visit (INDEPENDENT_AMBULATORY_CARE_PROVIDER_SITE_OTHER): Payer: PRIVATE HEALTH INSURANCE | Admitting: Urology

## 2023-05-06 ENCOUNTER — Encounter: Payer: Self-pay | Admitting: Urology

## 2023-05-06 VITALS — BP 155/74 | HR 60

## 2023-05-06 DIAGNOSIS — R3915 Urgency of urination: Secondary | ICD-10-CM

## 2023-05-06 DIAGNOSIS — R198 Other specified symptoms and signs involving the digestive system and abdomen: Secondary | ICD-10-CM

## 2023-05-06 DIAGNOSIS — Z6826 Body mass index (BMI) 26.0-26.9, adult: Secondary | ICD-10-CM

## 2023-05-06 DIAGNOSIS — R350 Frequency of micturition: Secondary | ICD-10-CM | POA: Diagnosis not present

## 2023-05-06 DIAGNOSIS — N200 Calculus of kidney: Secondary | ICD-10-CM | POA: Insufficient documentation

## 2023-05-06 DIAGNOSIS — Z87442 Personal history of urinary calculi: Secondary | ICD-10-CM

## 2023-05-06 DIAGNOSIS — R102 Pelvic and perineal pain: Secondary | ICD-10-CM

## 2023-05-06 LAB — URINALYSIS, ROUTINE W REFLEX MICROSCOPIC
Bilirubin, UA: NEGATIVE
Ketones, UA: NEGATIVE
Leukocytes,UA: NEGATIVE
Nitrite, UA: NEGATIVE
Specific Gravity, UA: 1.03 (ref 1.005–1.030)
Urobilinogen, Ur: 0.2 mg/dL (ref 0.2–1.0)
pH, UA: 5.5 (ref 5.0–7.5)

## 2023-05-06 LAB — BLADDER SCAN AMB NON-IMAGING: Scan Result: 32

## 2023-05-06 LAB — MICROSCOPIC EXAMINATION: Bacteria, UA: NONE SEEN

## 2023-05-06 NOTE — Addendum Note (Signed)
Addended by: Marcine Matar on: 05/06/2023 09:49 AM   Modules accepted: Orders

## 2023-05-09 ENCOUNTER — Other Ambulatory Visit: Payer: Self-pay

## 2023-05-09 ENCOUNTER — Encounter (HOSPITAL_BASED_OUTPATIENT_CLINIC_OR_DEPARTMENT_OTHER): Payer: Self-pay | Admitting: Urology

## 2023-05-09 NOTE — Progress Notes (Signed)
Spoke w/ via phone for pre-op interview---pt Lab needs dos----   none            Lab results------none COVID test -----patient states asymptomatic no test needed Arrive at -------830 am 05-20-2023 NPO after MN NO Solid Food.  Clear liquids from MN until---730 Med rec completed Medications to take morning of surgery -----none Diabetic medication -----n/a Patient instructed no nail polish to be worn day of surgery Patient instructed to bring photo id and insurance card day of surgery Patient aware to have Driver (ride ) / caregiver  wife Matthew Bradford  for 24 hours after surgery  Patient Special Instructions -----none Pre-Op special Instructions -----none Patient verbalized understanding of instructions that were given at this phone interview. Patient denies shortness of breath, chest pain, fever, cough at this phone interview.

## 2023-05-15 LAB — CALCULI, WITH PHOTOGRAPH (CLINICAL LAB)

## 2023-05-20 ENCOUNTER — Encounter (HOSPITAL_BASED_OUTPATIENT_CLINIC_OR_DEPARTMENT_OTHER): Payer: Self-pay | Admitting: Urology

## 2023-05-20 ENCOUNTER — Ambulatory Visit (HOSPITAL_BASED_OUTPATIENT_CLINIC_OR_DEPARTMENT_OTHER): Payer: PRIVATE HEALTH INSURANCE | Admitting: Certified Registered Nurse Anesthetist

## 2023-05-20 ENCOUNTER — Other Ambulatory Visit: Payer: Self-pay

## 2023-05-20 ENCOUNTER — Encounter (HOSPITAL_BASED_OUTPATIENT_CLINIC_OR_DEPARTMENT_OTHER)
Admission: RE | Disposition: A | Discharge status: Home / Self Care | Payer: Self-pay | Source: Home / Self Care | Attending: Urology

## 2023-05-20 ENCOUNTER — Ambulatory Visit (HOSPITAL_BASED_OUTPATIENT_CLINIC_OR_DEPARTMENT_OTHER)
Admission: RE | Admit: 2023-05-20 | Discharge: 2023-05-20 | Disposition: A | Discharge status: Home / Self Care | Payer: PRIVATE HEALTH INSURANCE | Attending: Urology | Admitting: Urology

## 2023-05-20 DIAGNOSIS — N210 Calculus in bladder: Secondary | ICD-10-CM | POA: Diagnosis present

## 2023-05-20 DIAGNOSIS — Z87891 Personal history of nicotine dependence: Secondary | ICD-10-CM | POA: Diagnosis not present

## 2023-05-20 DIAGNOSIS — Z01818 Encounter for other preprocedural examination: Secondary | ICD-10-CM

## 2023-05-20 DIAGNOSIS — Z87442 Personal history of urinary calculi: Secondary | ICD-10-CM | POA: Diagnosis not present

## 2023-05-20 HISTORY — PX: HOLMIUM LASER APPLICATION: SHX5852

## 2023-05-20 HISTORY — PX: CYSTOSCOPY WITH LITHOLAPAXY: SHX1425

## 2023-05-20 SURGERY — CYSTOSCOPY, WITH BLADDER CALCULUS LITHOLAPAXY
Anesthesia: General | Site: Bladder

## 2023-05-20 MED ORDER — ONDANSETRON HCL 4 MG/2ML IJ SOLN: 4.0000 mg | Freq: Once | INTRAMUSCULAR | Status: DC | PRN

## 2023-05-20 MED ORDER — CEPHALEXIN 500 MG PO CAPS: 500.0000 mg | ORAL_CAPSULE | Freq: Two times a day (BID) | ORAL | 0 refills | Status: AC

## 2023-05-20 MED ORDER — DEXAMETHASONE SODIUM PHOSPHATE 10 MG/ML IJ SOLN
INTRAMUSCULAR | Status: AC
  Filled 2023-05-20: qty 3

## 2023-05-20 MED ORDER — CEFAZOLIN SODIUM-DEXTROSE 2-4 GM/100ML-% IV SOLN
2.0000 g | INTRAVENOUS | Status: AC
  Administered 2023-05-20: 2 g via INTRAVENOUS

## 2023-05-20 MED ORDER — KETOROLAC TROMETHAMINE 30 MG/ML IJ SOLN
INTRAMUSCULAR | Status: AC
  Filled 2023-05-20: qty 3

## 2023-05-20 MED ORDER — ACETAMINOPHEN 10 MG/ML IV SOLN: 1000.0000 mg | Freq: Once | INTRAVENOUS | Status: DC | PRN

## 2023-05-20 MED ORDER — MIDAZOLAM HCL 2 MG/2ML IJ SOLN
INTRAMUSCULAR | Status: AC
  Filled 2023-05-20: qty 2

## 2023-05-20 MED ORDER — PROPOFOL 10 MG/ML IV BOLUS
INTRAVENOUS | Status: DC | PRN
Start: 2023-05-20 — End: 2023-05-20
  Administered 2023-05-20: 120 mg via INTRAVENOUS

## 2023-05-20 MED ORDER — EPHEDRINE SULFATE-NACL 50-0.9 MG/10ML-% IV SOSY
PREFILLED_SYRINGE | INTRAVENOUS | Status: DC | PRN
  Administered 2023-05-20: 5 mg via INTRAVENOUS

## 2023-05-20 MED ORDER — STERILE WATER FOR IRRIGATION IR SOLN
Status: DC | PRN
  Administered 2023-05-20: 1000 mL

## 2023-05-20 MED ORDER — FENTANYL CITRATE (PF) 250 MCG/5ML IJ SOLN
INTRAMUSCULAR | Status: DC | PRN
  Administered 2023-05-20: 50 ug via INTRAVENOUS
  Administered 2023-05-20 (×2): 25 ug via INTRAVENOUS

## 2023-05-20 MED ORDER — MIDAZOLAM HCL 2 MG/2ML IJ SOLN
INTRAMUSCULAR | Status: DC | PRN
  Administered 2023-05-20: 2 mg via INTRAVENOUS

## 2023-05-20 MED ORDER — FENTANYL CITRATE (PF) 100 MCG/2ML IJ SOLN
INTRAMUSCULAR | Status: AC
  Filled 2023-05-20: qty 2

## 2023-05-20 MED ORDER — FENTANYL CITRATE (PF) 100 MCG/2ML IJ SOLN
25.0000 ug | INTRAMUSCULAR | Status: DC | PRN
  Administered 2023-05-20: 50 ug via INTRAVENOUS

## 2023-05-20 MED ORDER — SUCCINYLCHOLINE CHLORIDE 200 MG/10ML IV SOSY
PREFILLED_SYRINGE | INTRAVENOUS | Status: AC
  Filled 2023-05-20: qty 10

## 2023-05-20 MED ORDER — PROPOFOL 10 MG/ML IV BOLUS
INTRAVENOUS | Status: AC
  Filled 2023-05-20: qty 20

## 2023-05-20 MED ORDER — PHENYLEPHRINE 80 MCG/ML (10ML) SYRINGE FOR IV PUSH (FOR BLOOD PRESSURE SUPPORT)
PREFILLED_SYRINGE | INTRAVENOUS | Status: DC | PRN
  Administered 2023-05-20: 80 ug via INTRAVENOUS

## 2023-05-20 MED ORDER — LIDOCAINE HCL (PF) 2 % IJ SOLN
INTRAMUSCULAR | Status: AC
  Filled 2023-05-20: qty 15

## 2023-05-20 MED ORDER — EPHEDRINE 5 MG/ML INJ
INTRAVENOUS | Status: AC
  Filled 2023-05-20: qty 10

## 2023-05-20 MED ORDER — SODIUM CHLORIDE 0.9 % IR SOLN
Status: DC | PRN
  Administered 2023-05-20 (×2): 3000 mL

## 2023-05-20 MED ORDER — ONDANSETRON HCL 4 MG/2ML IJ SOLN
INTRAMUSCULAR | Status: DC | PRN
  Administered 2023-05-20: 4 mg via INTRAVENOUS

## 2023-05-20 MED ORDER — LIDOCAINE 2% (20 MG/ML) 5 ML SYRINGE
INTRAMUSCULAR | Status: DC | PRN
  Administered 2023-05-20: 100 mg via INTRAVENOUS

## 2023-05-20 MED ORDER — LACTATED RINGERS IV SOLN: INTRAVENOUS | Status: DC

## 2023-05-20 MED ORDER — ONDANSETRON HCL 4 MG/2ML IJ SOLN
INTRAMUSCULAR | Status: AC
  Filled 2023-05-20: qty 6

## 2023-05-20 MED ORDER — DEXAMETHASONE SODIUM PHOSPHATE 10 MG/ML IJ SOLN
INTRAMUSCULAR | Status: DC | PRN
  Administered 2023-05-20: 10 mg via INTRAVENOUS

## 2023-05-20 MED ORDER — PHENYLEPHRINE 80 MCG/ML (10ML) SYRINGE FOR IV PUSH (FOR BLOOD PRESSURE SUPPORT)
PREFILLED_SYRINGE | INTRAVENOUS | Status: AC
  Filled 2023-05-20: qty 30

## 2023-05-20 MED ORDER — CEFAZOLIN SODIUM-DEXTROSE 2-4 GM/100ML-% IV SOLN
INTRAVENOUS | Status: AC
  Filled 2023-05-20: qty 100

## 2023-05-20 MED ORDER — ONDANSETRON HCL 4 MG/2ML IJ SOLN
INTRAMUSCULAR | Status: AC
  Filled 2023-05-20: qty 8

## 2023-05-20 SURGICAL SUPPLY — 25 items
BAG DRAIN URO-CYSTO SKYTR STRL (DRAIN) ×1 IMPLANT
BAG DRN UROCATH (DRAIN) ×1
BLANKET WARM UPPER BOD BAIR (MISCELLANEOUS) ×1 IMPLANT
CATH FOLEY 2WAY SLVR 5CC 18FR (CATHETERS) IMPLANT
CLOTH BEACON ORANGE TIMEOUT ST (SAFETY) ×2 IMPLANT
GLOVE BIO SURGEON STRL SZ8 (GLOVE) ×1 IMPLANT
GLOVE SURG SYN 6.5 ES PF (GLOVE) ×2
GLOVE SURG SYN 6.5 PF PI (GLOVE) IMPLANT
GOWN STRL REUS W/TWL LRG LVL3 (GOWN DISPOSABLE) IMPLANT
GOWN STRL REUS W/TWL XL LVL3 (GOWN DISPOSABLE) ×1 IMPLANT
IV NS IRRIG 3000ML ARTHROMATIC (IV SOLUTION) IMPLANT
KIT TURNOVER CYSTO (KITS) ×1 IMPLANT
LASER FIB FLEXIVA PULSE ID 365 (Laser) IMPLANT
LASER FIB FLEXIVA PULSE ID 550 (Laser) IMPLANT
LASER FIB FLEXIVA PULSE ID 910 (Laser) IMPLANT
MANIFOLD NEPTUNE II (INSTRUMENTS) ×1 IMPLANT
PACK CYSTO (CUSTOM PROCEDURE TRAY) ×1 IMPLANT
SLEEVE SCD COMPRESS KNEE MED (STOCKING) ×1 IMPLANT
SYR TOOMEY IRRIG 70ML (MISCELLANEOUS) ×1
SYRINGE TOOMEY IRRIG 70ML (MISCELLANEOUS) IMPLANT
TRACTIP FLEXIVA PULS ID 200XHI (Laser) IMPLANT
TRACTIP FLEXIVA PULSE ID 200 (Laser)
TUBE CONNECTING 12X1/4 (SUCTIONS) ×1 IMPLANT
WATER STERILE IRR 1000ML POUR (IV SOLUTION) IMPLANT
WATER STERILE IRR 500ML POUR (IV SOLUTION) IMPLANT

## 2023-05-20 NOTE — Progress Notes (Signed)
Catheter care explained/demonstrated to both patient and spouse. Both verbalized understanding. Demonstrated how to remove /discontinue catheter tomorrow morning per order. Both verbalized understanding. Gloves, Graduate, 10cc syringe given for removal.

## 2023-05-20 NOTE — Anesthesia Procedure Notes (Signed)
Procedure Name: LMA Insertion Date/Time: 05/20/2023 10:02 AM  Performed by: Dairl Ponder, CRNAPre-anesthesia Checklist: Patient identified, Emergency Drugs available, Suction available and Patient being monitored Patient Re-evaluated:Patient Re-evaluated prior to induction Oxygen Delivery Method: Circle System Utilized Preoxygenation: Pre-oxygenation with 100% oxygen Induction Type: IV induction Ventilation: Mask ventilation without difficulty LMA: LMA inserted LMA Size: 4.0 Number of attempts: 1 Airway Equipment and Method: Bite block Placement Confirmation: positive ETCO2 Tube secured with: Tape Dental Injury: Teeth and Oropharynx as per pre-operative assessment

## 2023-05-20 NOTE — Interval H&P Note (Signed)
History and Physical Interval Note:  05/20/2023 8:59 AM  Matthew Bradford  has presented today for surgery, with the diagnosis of Bladder stone.  The various methods of treatment have been discussed with the patient and family. After consideration of risks, benefits and other options for treatment, the patient has consented to  Procedure(s): CYSTOSCOPY WITH LITHOLAPAXY (N/A) HOLMIUM LASER APPLICATION (N/A) as a surgical intervention.  The patient's history has been reviewed, patient examined, no change in status, stable for surgery.  I have reviewed the patient's chart and labs.  Questions were answered to the patient's satisfaction.     Bertram Millard Falana Clagg

## 2023-05-20 NOTE — H&P (Signed)
H&P  Chief Complaint: Bladder calculus  History of Present Illness: 62 year old male with history of urolithiasis presents at this time for cystolitholapaxy for a 21 mm bladder calculus.  Past Medical History:  Diagnosis Date   Bladder stone    Frequency of urination    Hematuria    History of cellulitis    03-2016  finger   History of kidney stones    Urgency of urination    Wears glasses     Past Surgical History:  Procedure Laterality Date   COLONOSCOPY N/A 06/09/2020   Procedure: COLONOSCOPY;  Surgeon: Malissa Hippo, MD;  [facility];  Service: Endoscopy;  Laterality: N/A;  930   CYSTOSCOPY WITH LITHOLAPAXY N/A 09/07/2016   Procedure: CYSTOSCOPY WITH LITHOLAPAXY;  Surgeon: Marcine Matar, MD;  Location: [facility];  Service: Urology;  Laterality: N/A;   CYSTOSCOPY WITH RETROGRADE PYELOGRAM, URETEROSCOPY AND STENT PLACEMENT Left 12/16/2020   Procedure: CYSTOSCOPY WITH RETROGRADE PYELOGRAM, URETEROSCOPY AND STENT PLACEMENT;  Surgeon: Noel Christmas, MD;  Location: [facility];  Service: Urology;  Laterality: Left;  45 MINS   CYSTOSCOPY/RETROGRADE/URETEROSCOPY/STONE EXTRACTION WITH BASKET  1996   EXTRACORPOREAL SHOCK WAVE LITHOTRIPSY  11/29/2014   EXTRACORPOREAL SHOCK WAVE LITHOTRIPSY Left 05/15/2018   Procedure: LEFT EXTRACORPOREAL SHOCK WAVE LITHOTRIPSY (ESWL);  Surgeon: Crist Fat, MD;  Location: [facility];  Service: Urology;  Laterality: Left;   HOLMIUM LASER APPLICATION N/A 09/07/2016   Procedure: HOLMIUM LASER APPLICATION;  Surgeon: Marcine Matar, MD;  Location: [facility];  Service: Urology;  Laterality: N/A;   HOLMIUM LASER APPLICATION Left 12/16/2020   Procedure: HOLMIUM LASER APPLICATION;  Surgeon: Noel Christmas, MD;  Location: [facility];  Service: Urology;  Laterality: Left;   I & D EXTREMITY Left 05/09/2016   Procedure: IRRIGATION AND DEBRIDEMENT LEFT INDEX FINGER;  Surgeon: Tarry Kos, MD;  Location: [facility];  Service: Orthopedics;  Laterality: Left;   POLYPECTOMY  06/09/2020   Procedure: POLYPECTOMY;  Surgeon: Malissa Hippo, MD;  [facility];  Service: Endoscopy;;   QUADRICEPS TENDON REPAIR Left 08/15/2021   Procedure: REPAIR QUADRICEP TENDON;  Surgeon: Sheral Apley, MD;  Location: [facility];  Service: Orthopedics;  Laterality: Left;   TONSILLECTOMY  1979    Home Medications:    Allergies:  Allergies  Allergen Reactions   Ibuprofen Other (See Comments)    "feels like gas build up in abdomen"   Nsaids     "feels like gas build up in abdomen"   Oxycodone     Nightmares, insomnia     History reviewed. No pertinent family history.  Social History:  reports that he quit smoking about 13 years ago. His smoking use included cigarettes. He started smoking about 43 years ago. His smokeless tobacco use includes chew. He reports that he does not drink alcohol and does not use drugs.  ROS: A complete review of systems was performed.  All systems are negative except for pertinent findings as noted.  Physical Exam:  Vital signs in last 24 hours: BP (!) 150/85   Pulse (!) 54   Temp 97.6 F (36.4 C) (Oral)   Resp 17   Ht 5\' 10"  (1.778 m)   Wt 80.7 kg   SpO2 98%   BMI 25.54 kg/m  Constitutional:  Alert and oriented, No acute distress Cardiovascular: Regular rate  Respiratory: Normal respiratory effort Neurologic: Grossly intact, no focal deficits Psychiatric: Normal mood and affect  I have  reviewed prior pt notes  I have reviewed urinalysis results  I have independently reviewed prior imaging     Impression/Assessment:  21 mm bladder calculus  Plan:  Cystolitholapaxy

## 2023-05-20 NOTE — Anesthesia Postprocedure Evaluation (Signed)
Anesthesia Post Note  Patient: Matthew Bradford  Procedure(s) Performed: CYSTOSCOPY WITH LITHOLAPAXY (Bladder) HOLMIUM LASER APPLICATION (Bladder)     Patient location during evaluation: PACU Anesthesia Type: General Level of consciousness: awake and alert Pain management: pain level controlled Vital Signs Assessment: post-procedure vital signs reviewed and stable Respiratory status: spontaneous breathing, nonlabored ventilation, respiratory function stable and patient connected to nasal cannula oxygen Cardiovascular status: blood pressure returned to baseline and stable Postop Assessment: no apparent nausea or vomiting Anesthetic complications: no   No notable events documented.  Last Vitals:  Vitals:   05/20/23 1115 05/20/23 1200  BP: 126/86 (!) 154/81  Pulse: (!) 58 (!) 55  Resp: 18 16  Temp:  (!) 36.3 C  SpO2: 93% 97%    Last Pain:  Vitals:   05/20/23 1200  TempSrc:   PainSc: 4                  Mariann Barter

## 2023-05-20 NOTE — Anesthesia Preprocedure Evaluation (Addendum)
Anesthesia Evaluation  Patient identified by MRN, date of birth, ID band Patient awake    Reviewed: Allergy & Precautions, NPO status , Patient's Chart, lab work & pertinent test results, reviewed documented beta blocker date and time   History of Anesthesia Complications Negative for: history of anesthetic complications  Airway Mallampati: III  TM Distance: >3 FB Neck ROM: Full    Dental no notable dental hx. (+) Caps,    Pulmonary neg COPD, former smoker   breath sounds clear to auscultation       Cardiovascular Exercise Tolerance: Good (-) hypertension(-) angina (-) CAD, (-) Past MI, (-) Cardiac Stents, (-) CABG, (-) CHF, (-) Orthopnea and (-) PND (-) pacemaker(-) Cardiac Defibrillator  Rhythm:Regular Rate:Normal     Neuro/Psych neg Seizures    GI/Hepatic ,neg GERD  ,,(+) neg Cirrhosis        Endo/Other  neg diabetes    Renal/GU Renal disease (stone disease)     Musculoskeletal   Abdominal   Peds  Hematology   Anesthesia Other Findings   Reproductive/Obstetrics                             Anesthesia Physical Anesthesia Plan  ASA: 2  Anesthesia Plan: General   Post-op Pain Management:    Induction:   PONV Risk Score and Plan: 1 and Ondansetron and Dexamethasone  Airway Management Planned: LMA  Additional Equipment:   Intra-op Plan:   Post-operative Plan:   Informed Consent: I have reviewed the patients History and Physical, chart, labs and discussed the procedure including the risks, benefits and alternatives for the proposed anesthesia with the patient or authorized representative who has indicated his/her understanding and acceptance.     Dental advisory given  Plan Discussed with: CRNA  Anesthesia Plan Comments:         Anesthesia Quick Evaluation

## 2023-05-20 NOTE — Transfer of Care (Signed)
Immediate Anesthesia Transfer of Care Note  Patient: Matthew Bradford  Procedure(s) Performed: CYSTOSCOPY WITH LITHOLAPAXY (Bladder) HOLMIUM LASER APPLICATION (Bladder)  Patient Location: PACU  Anesthesia Type:General  Level of Consciousness: awake, alert , and oriented  Airway & Oxygen Therapy: Patient Spontanous Breathing  Post-op Assessment: Report given to RN and Post -op Vital signs reviewed and stable  Post vital signs: Reviewed and stable  Last Vitals:  Vitals Value Taken Time  BP 171/122 05/20/23 1051  Temp    Pulse 64 05/20/23 1054  Resp 12 05/20/23 1054  SpO2 95 % 05/20/23 1054  Vitals shown include unfiled device data.  Last Pain:  Vitals:   05/20/23 0844  TempSrc: Oral  PainSc: 0-No pain      Patients Stated Pain Goal: 4 (05/20/23 0844)  Complications: No notable events documented.

## 2023-05-20 NOTE — Discharge Instructions (Addendum)
You may see some blood in the urine and may have some burning with urination for 48-72 hours. You also may notice that you have to urinate more frequently or urgently after your procedure which is normal.  You should call should you develop an inability urinate, fever > 101, persistent nausea and vomiting that prevents you from eating or drinking to stay hydrated. If you have a catheter, you will be taught how to take care of the catheter by the nursing staff prior to discharge from the hospital.  You may periodically feel a strong urge to void with the catheter in place.  This is a bladder spasm and most often can occur when having a bowel movement or moving around. It is typically self-limited and usually will stop after a few minutes.  You may use some Vaseline or Neosporin around the tip of the catheter to reduce friction at the tip of the penis. You may also see some blood in the urine.  A very small amount of blood can make the urine look quite red.  As long as the catheter is draining well, there usually is not a problem.  However, if the catheter is not draining well and is bloody, you should call the office [phone]) to notify us.  Remove the catheter as instructed by the nurses on Tuesday morning.        CYSTOSCOPY HOME CARE INSTRUCTIONS  Activity: Rest for the remainder of the day.  Do not drive or operate equipment today.  You may resume normal activities in one to two days as instructed by your physician.   Meals: Drink plenty of liquids and eat light foods such as gelatin or soup this evening.  You may return to a normal meal plan tomorrow.  Return to Work: You may return to work in one to two days or as instructed by your physician.  Special Instructions / Symptoms: Call your physician if any of these symptoms occur:   -persistent or heavy bleeding  -bleeding which continues after first few urination  -large blood clots that are difficult to pass  -urine stream  diminishes or stops completely  -fever equal to or higher than 101 degrees Farenheit.  -cloudy urine with a strong, foul odor  -severe pain  Females should always wipe from front to back after elimination.  You may feel some burning pain when you urinate.  This should disappear with time.  Applying moist heat to the lower abdomen or a hot tub bath may help relieve the pain.

## 2023-05-20 NOTE — Interval H&P Note (Signed)
History and Physical Interval Note:  05/20/2023 8:50 AM  Matthew Bradford  has presented today for surgery, with the diagnosis of Bladder stone.  The various methods of treatment have been discussed with the patient and family. After consideration of risks, benefits and other options for treatment, the patient has consented to  Procedure(s): CYSTOSCOPY WITH LITHOLAPAXY (N/A) HOLMIUM LASER APPLICATION (N/A) as a surgical intervention.  The patient's history has been reviewed, patient examined, no change in status, stable for surgery.  I have reviewed the patient's chart and labs.  Questions were answered to the patient's satisfaction.     Bertram Millard Wetona Viramontes

## 2023-05-20 NOTE — Op Note (Signed)
Preoperative diagnosis: 21 mm bladder calculus  Postoperative diagnosis: Same  Procedure: Cystolitholapaxy of 21 mm bladder calculus  Surgeon: Gianpaolo Mindel  Anesthesia: General With LMA  Complications: None  Specimen: Stone fragments  Estimated blood loss: Less than 10 mL  Indications: 62 year old male with history of recurrent urolithiasis.  Recently diagnosed with 21 mm, quite symptomatic bladder calculus.  He presents at this time for cystolitholapaxy.  Findings: Urethra was normal, without lesion/stricture.  Prostate minimally obstructive with bilobar hypertrophy.  Somewhat of a right high riding bladder neck.  Ureteral orifices were normal in location and configuration.  No bladder lesions.  Solitary large bladder calculus.  Description of procedure: Patient properly identified in the holding area, taken to the operating room where general anesthetic was administered.  He was placed in the dorsolithotomy position, genitalia and perineum were prepped, draped, proper timeout performed.  24 French cystoscope was passed after dilating the meatus to 30 [place] with [employer].  The bladder was inspected, with the above-mentioned findings.  The laser bridge was then passed.  1000 m fiber utilized to fragment the stone with settings of the laser (holmium) 53 Hz and 1.0 J.  Multiple smaller fragments were then obtained.  These were then drained directly from the bladder.  Tiny particles were irrigated using the [place] syringe.  No fragments were noted at this point.  The scope was then removed.  I did pass an 66 French Foley catheter, balloon filled with 10 cc of water and hooked to dependent drainage.  At this point, the procedure was terminated.  The patient was awakened, taken to the PACU in stable condition having tolerated procedure well.

## 2023-05-21 ENCOUNTER — Encounter (HOSPITAL_BASED_OUTPATIENT_CLINIC_OR_DEPARTMENT_OTHER): Payer: Self-pay | Admitting: Urology

## 2023-05-21 ENCOUNTER — Telehealth: Payer: Self-pay

## 2023-05-21 NOTE — Telephone Encounter (Signed)
Patient is made aware of Dr. Retta Diones response "I figured this stone was going to be made out of uric acid as it was not well-seen on the x-ray.  There are some pretty good things we can do to limit recurrence of these.  I will discuss these with you at your upcoming appointment." Voiced understanding

## 2023-06-10 NOTE — Progress Notes (Signed)
History of Present Illness: Matthew Bradford is here for f/u of recent cystolithalopaxy of a 21 mm bladder calculus on 8.26.2024. Stone analysis--uric acid.  Since his procedure he has had no real urinary issues.  Past Medical History:  Diagnosis Date   Bladder stone    Frequency of urination    Hematuria    History of cellulitis    03-2016  finger   History of kidney stones    Urgency of urination    Wears glasses     Past Surgical History:  Procedure Laterality Date   COLONOSCOPY N/A 06/09/2020   Procedure: COLONOSCOPY;  Surgeon: Malissa Hippo, MD;  [facility];  Service: Endoscopy;  Laterality: N/A;  930   CYSTOSCOPY WITH LITHOLAPAXY N/A 09/07/2016   Procedure: CYSTOSCOPY WITH LITHOLAPAXY;  Surgeon: Marcine Matar, MD;  Location: [facility];  Service: Urology;  Laterality: N/A;   CYSTOSCOPY WITH LITHOLAPAXY N/A 05/20/2023   Procedure: CYSTOSCOPY WITH LITHOLAPAXY;  Surgeon: Marcine Matar, MD;  Location: [facility];  Service: Urology;  Laterality: N/A;   CYSTOSCOPY WITH RETROGRADE PYELOGRAM, URETEROSCOPY AND STENT PLACEMENT Left 12/16/2020   Procedure: CYSTOSCOPY WITH RETROGRADE PYELOGRAM, URETEROSCOPY AND STENT PLACEMENT;  Surgeon: Noel Christmas, MD;  [facility]: [facility];  Service: Urology;  Laterality: Left;  45 MINS   CYSTOSCOPY/RETROGRADE/URETEROSCOPY/STONE EXTRACTION WITH BASKET  1996   EXTRACORPOREAL SHOCK WAVE LITHOTRIPSY  11/29/2014   EXTRACORPOREAL SHOCK WAVE LITHOTRIPSY Left 05/15/2018   Procedure: LEFT EXTRACORPOREAL SHOCK WAVE LITHOTRIPSY (ESWL);  Surgeon: Crist Fat, MD;  [facility]: [facility];  Service: Urology;  Laterality: Left;   HOLMIUM LASER APPLICATION N/A 09/07/2016   Procedure: HOLMIUM LASER APPLICATION;  Surgeon: Marcine Matar, MD;  Location: [facility];  Service: Urology;  Laterality: N/A;   HOLMIUM LASER APPLICATION Left 12/16/2020   Procedure: HOLMIUM LASER APPLICATION;  Surgeon: Noel Christmas, MD;  [facility];  Service: Urology;  Laterality: Left;   HOLMIUM LASER APPLICATION N/A 05/20/2023   Procedure: HOLMIUM LASER APPLICATION;  Surgeon: Marcine Matar, MD;  Location[facility];  Service: Urology;  Laterality: N/A;   I & D EXTREMITY Left 05/09/2016   Procedure: IRRIGATION AND DEBRIDEMENT LEFT INDEX FINGER;  Surgeon: Tarry Kos, MD;  Location: [facility];  Service: Orthopedics;  Laterality: Left;   POLYPECTOMY  06/09/2020   Procedure: POLYPECTOMY;  Surgeon: Malissa Hippo, MD;  [facility];  Service: Endoscopy;;   QUADRICEPS TENDON REPAIR Left 08/15/2021   Procedure: REPAIR QUADRICEP TENDON;  Surgeon: Sheral Apley, MD;  Location: [facility];  Service: Orthopedics;  Laterality: Left;   TONSILLECTOMY  1979    Home Medications:  Allergies as of 06/11/2023       Reactions   Ibuprofen Other (See Comments)   "feels like gas build up in abdomen"   Nsaids    "feels like gas build up in abdomen"   Oxycodone    Nightmares, insomnia         Medication List        Accurate as of June 10, 2023  7:05 PM. If you have any questions, ask your nurse or doctor.          ondansetron 4 MG disintegrating tablet Commonly known as: ZOFRAN-ODT Take 1 tablet (4 mg total) by mouth every 8 (eight) hours as needed for nausea or vomiting.        Allergies:  Allergies  Allergen Reactions   Ibuprofen Other (See Comments)    "  feels like gas build up in abdomen"   Nsaids     "feels like gas build up in abdomen"   Oxycodone     Nightmares, insomnia     No family history on file.  Social History:  reports that he quit smoking about 13 years ago. His smoking use included cigarettes. He started smoking about 43 years ago. His smokeless tobacco use includes chew. He reports that he does not drink alcohol and does not use drugs.  ROS: A complete review of systems was performed.  All systems are negative except for pertinent  findings as noted.  Physical Exam:  Vital signs in last 24 hours: There were no vitals taken for this visit. Constitutional:  Alert and oriented, No acute distress Cardiovascular: Regular rate  Respiratory: Normal respiratory effort Neurologic: Grossly intact, no focal deficits Psychiatric: Normal mood and affect  I have reviewed prior pt notes  I have reviewed urinalysis results  I have independently reviewed prior imaging--CT images  I have reviewed prior stone analysis results    Impression/Assessment:  History of urolithiasis with recent cystolitholapaxy of a large bladder stone, doing well.  Uric acid urolithiasis  Plan:  1.  I will draw serum uric acid today, also send patient home with 24-hour urine request  2.  We will contact him with results  3.  Office visit in 4 months following renal ultrasound

## 2023-06-11 ENCOUNTER — Encounter: Payer: Self-pay | Admitting: Urology

## 2023-06-11 ENCOUNTER — Ambulatory Visit (INDEPENDENT_AMBULATORY_CARE_PROVIDER_SITE_OTHER): Payer: PRIVATE HEALTH INSURANCE | Admitting: Urology

## 2023-06-11 VITALS — BP 136/72 | HR 57

## 2023-06-11 DIAGNOSIS — N210 Calculus in bladder: Secondary | ICD-10-CM

## 2023-06-11 DIAGNOSIS — Z87442 Personal history of urinary calculi: Secondary | ICD-10-CM | POA: Diagnosis not present

## 2023-06-11 DIAGNOSIS — N200 Calculus of kidney: Secondary | ICD-10-CM

## 2023-06-11 DIAGNOSIS — Z09 Encounter for follow-up examination after completed treatment for conditions other than malignant neoplasm: Secondary | ICD-10-CM | POA: Diagnosis not present

## 2023-06-11 LAB — URINALYSIS, ROUTINE W REFLEX MICROSCOPIC
Bilirubin, UA: NEGATIVE
Glucose, UA: NEGATIVE
Ketones, UA: NEGATIVE
Leukocytes,UA: NEGATIVE
Nitrite, UA: NEGATIVE
Protein,UA: NEGATIVE
Specific Gravity, UA: 1.02 (ref 1.005–1.030)
Urobilinogen, Ur: 0.2 mg/dL (ref 0.2–1.0)
pH, UA: 6.5 (ref 5.0–7.5)

## 2023-06-11 LAB — MICROSCOPIC EXAMINATION: Bacteria, UA: NONE SEEN

## 2023-06-14 ENCOUNTER — Telehealth: Payer: Self-pay

## 2023-06-14 NOTE — Telephone Encounter (Signed)
-----   Message from Bertram Millard Dahlstedt sent at 06/13/2023  9:09 AM EDT ----- Notify patient that uric acid level is normal. ----- Message ----- From: Interface, [facility] Lab Results In Sent: 06/11/2023   3:36 PM EDT To: Marcine Matar, MD

## 2023-06-14 NOTE — Telephone Encounter (Signed)
Patient is made aware and voiced understanding. 

## 2023-07-08 ENCOUNTER — Other Ambulatory Visit: Payer: Self-pay | Admitting: Urology

## 2023-08-01 LAB — LITHOLINK 24HR URINE PANEL
Ammonium, Urine: 27 mmol/(24.h) (ref 15–60)
Calcium Oxalate Saturation: 12.59 — ABNORMAL HIGH (ref 6.00–10.00)
Calcium Phosphate Saturation: 0.45 — ABNORMAL LOW (ref 0.50–2.00)
Calcium, Urine: 106 mg/(24.h) (ref <250)
Calcium/Creatinine Ratio: 73 mg/g{creat} (ref 34–196)
Calcium/Kg Body Weight: 1.3 mg/kg/d (ref <4.0)
Chloride, Urine: 64 mmol/(24.h) — ABNORMAL LOW (ref 70–250)
Citrate, Urine: 163 mg/(24.h) — ABNORMAL LOW (ref >450)
Creatinine, Urine: 1464 mg/(24.h)
Creatinine/Kg Body Weight: 17.9 mg/kg/d (ref 11.9–24.4)
Cystine, Urine, Qualitative: NEGATIVE
Magnesium, Urine: 53 mg/(24.h) (ref 30–120)
Oxalate, Urine: 27 mg/(24.h) (ref 20–40)
Phosphorus, Urine: 1254 mg/(24.h) — ABNORMAL HIGH (ref 600–1200)
Potassium, Urine: 28 mmol/(24.h) (ref 20–100)
Protein Catabolic Rate: 0.7 g/kg/d — ABNORMAL LOW (ref 0.8–1.4)
Sodium, Urine: 74 mmol/(24.h) (ref 50–150)
Sulfate, Urine: 28 meq/(24.h) (ref 20–80)
Urea Nitrogen, Urine: 6.86 g/(24.h) (ref 6.00–14.00)
Uric Acid Saturation: 2.98 — ABNORMAL HIGH (ref <1.00)
Uric Acid, Urine: 239 mg/(24.h) (ref <800)
Urine Volume (Preserved): 620 mL/(24.h) (ref 500–4000)
pH, 24 hr, Urine: 4.969 — ABNORMAL LOW (ref 5.800–6.200)

## 2023-08-01 LAB — SPECIMEN STATUS REPORT

## 2023-08-06 ENCOUNTER — Other Ambulatory Visit: Payer: Self-pay | Admitting: Urology

## 2023-08-06 ENCOUNTER — Telehealth: Payer: Self-pay | Admitting: Urology

## 2023-08-06 DIAGNOSIS — N200 Calculus of kidney: Secondary | ICD-10-CM

## 2023-08-06 MED ORDER — ALLOPURINOL 300 MG PO TABS: 300.0000 mg | ORAL_TABLET | Freq: Every day | ORAL | 3 refills | Status: DC

## 2023-08-06 NOTE — Telephone Encounter (Signed)
Patient called about his urine results from 07/08/23 , he would like to know what they mean, they are in his mychart?

## 2023-08-06 NOTE — Telephone Encounter (Signed)
Can you please review results and advise

## 2023-10-08 ENCOUNTER — Ambulatory Visit (HOSPITAL_COMMUNITY)
Admission: RE | Admit: 2023-10-08 | Discharge: 2023-10-08 | Disposition: A | Discharge status: Home / Self Care | Payer: PRIVATE HEALTH INSURANCE | Source: Ambulatory Visit | Attending: Urology | Admitting: Urology

## 2023-10-08 DIAGNOSIS — N200 Calculus of kidney: Secondary | ICD-10-CM | POA: Insufficient documentation

## 2023-10-14 NOTE — Progress Notes (Signed)
History of Present Illness: Matthew Bradford is here for f/u of recent cystolithalopaxy of a 21 mm bladder calculus on 8.26.2024.   Stone analysis--uric acid.  24 hr urine--  -Urine calcium 106 mg  -citrate 163  -uric acid 239  -volume 620  He is now on allopurinol 300 mg daily  Since his last visit, he is voiding well.  No symptoms of stone.  No gross hematuria.  No dysuria.     Past Medical History:  Diagnosis Date   Bladder stone    Frequency of urination    Hematuria    History of cellulitis    03-2016  finger   History of kidney stones    Urgency of urination    Wears glasses     Past Surgical History:  Procedure Laterality Date   COLONOSCOPY N/A 06/09/2020   Procedure: COLONOSCOPY;  Surgeon: Malissa Hippo, MD;  [facility];  Service: Endoscopy;  Laterality: N/A;  930   CYSTOSCOPY WITH LITHOLAPAXY N/A 09/07/2016   Procedure: CYSTOSCOPY WITH LITHOLAPAXY;  Surgeon: Marcine Matar, MD;  Location: [facility];  Service: Urology;  Laterality: N/A;   CYSTOSCOPY WITH LITHOLAPAXY N/A 05/20/2023   Procedure: CYSTOSCOPY WITH LITHOLAPAXY;  Surgeon: Marcine Matar, MD;  Location: [facility];  Service: Urology;  Laterality: N/A;   CYSTOSCOPY WITH RETROGRADE PYELOGRAM, URETEROSCOPY AND STENT PLACEMENT Left 12/16/2020   Procedure: CYSTOSCOPY WITH RETROGRADE PYELOGRAM, URETEROSCOPY AND STENT PLACEMENT;  Surgeon: Noel Christmas, MD;  [facility];  Service: Urology;  Laterality: Left;  45 MINS   CYSTOSCOPY/RETROGRADE/URETEROSCOPY/STONE EXTRACTION WITH BASKET  1996   EXTRACORPOREAL SHOCK WAVE LITHOTRIPSY  11/29/2014   EXTRACORPOREAL SHOCK WAVE LITHOTRIPSY Left 05/15/2018   Procedure: LEFT EXTRACORPOREAL SHOCK WAVE LITHOTRIPSY (ESWL);  Surgeon: Crist Fat, MD;  [facility];  Service: Urology;  Laterality: Left;   HOLMIUM LASER APPLICATION N/A 09/07/2016   Procedure: HOLMIUM LASER APPLICATION;  Surgeon: Marcine Matar, MD;   Location: [facility];  Service: Urology;  Laterality: N/A;   HOLMIUM LASER APPLICATION Left 12/16/2020   Procedure: HOLMIUM LASER APPLICATION;  Surgeon: Noel Christmas, MD;  [facility];  Service: Urology;  Laterality: Left;   HOLMIUM LASER APPLICATION N/A 05/20/2023   Procedure: HOLMIUM LASER APPLICATION;  Surgeon: Marcine Matar, MD;  Location: [facility];  Service: Urology;  Laterality: N/A;   I & D EXTREMITY Left 05/09/2016   Procedure: IRRIGATION AND DEBRIDEMENT LEFT INDEX FINGER;  Surgeon: Tarry Kos, MD;  Location: [facility];  Service: Orthopedics;  Laterality: Left;   POLYPECTOMY  06/09/2020   Procedure: POLYPECTOMY;  Surgeon: Malissa Hippo, MD;  [facility];  Service: Endoscopy;;   QUADRICEPS TENDON REPAIR Left 08/15/2021   Procedure: REPAIR QUADRICEP TENDON;  Surgeon: Sheral Apley, MD;  Location: [facility];  Service: Orthopedics;  Laterality: Left;   TONSILLECTOMY  1979    Home Medications:  Allergies as of 10/15/2023       Reactions   Ibuprofen Other (See Comments)   "feels like gas build up in abdomen"   Nsaids    "feels like gas build up in abdomen"   Oxycodone    Nightmares, insomnia         Medication List        Accurate as of October 14, 2023  9:06 AM. If you have any questions, ask your nurse or doctor.          allopurinol 300 MG  tablet Commonly known as: Zyloprim Take 1 tablet (300 mg total) by mouth daily.   ondansetron 4 MG disintegrating tablet Commonly known as: ZOFRAN-ODT Take 1 tablet (4 mg total) by mouth every 8 (eight) hours as needed for nausea or vomiting.        Allergies:  Allergies  Allergen Reactions   Ibuprofen Other (See Comments)    "feels like gas build up in abdomen"   Nsaids     "feels like gas build up in abdomen"   Oxycodone     Nightmares, insomnia     No family history on file.  Social History:  reports that he quit smoking about 13 years  ago. His smoking use included cigarettes. He started smoking about 43 years ago. His smokeless tobacco use includes chew. He reports that he does not drink alcohol and does not use drugs.  ROS: A complete review of systems was performed.  All systems are negative except for pertinent findings as noted.  Physical Exam:  Vital signs in last 24 hours: There were no vitals taken for this visit. Constitutional:  Alert and oriented, No acute distress Cardiovascular: Regular rate  Respiratory: Normal respiratory effort Neurologic: Grossly intact, no focal deficits Psychiatric: Normal mood and affect  I have reviewed prior pt notes  I have reviewed urinalysis results  I have independently reviewed prior imaging--CT images  I have reviewed prior stone analysis results  Recent renal ultrasound images/results reviewed.  Normal kidneys, no evidence of stones in kidneys or bladder.  IPSS sheet rteviewed--0/0    Impression/Assessment:  History of urolithiasis with recent cystolitholapaxy of a large bladder stone, doing well.  Uric acid urolithiasis.  On allopurinol now.  He had a low urine volume as well as low citrate  Plan:  1.  Dietary instructions given--increase H2O, lemon/lime juice in water  2.  Continue allopurinol  3. Office visit 1 year with ultrasound

## 2023-10-15 ENCOUNTER — Ambulatory Visit (INDEPENDENT_AMBULATORY_CARE_PROVIDER_SITE_OTHER): Payer: PRIVATE HEALTH INSURANCE | Admitting: Urology

## 2023-10-15 VITALS — BP 136/74 | HR 57

## 2023-10-15 DIAGNOSIS — Z87442 Personal history of urinary calculi: Secondary | ICD-10-CM | POA: Diagnosis not present

## 2023-10-15 DIAGNOSIS — Z09 Encounter for follow-up examination after completed treatment for conditions other than malignant neoplasm: Secondary | ICD-10-CM

## 2023-10-15 DIAGNOSIS — N210 Calculus in bladder: Secondary | ICD-10-CM

## 2023-10-15 DIAGNOSIS — N200 Calculus of kidney: Secondary | ICD-10-CM

## 2023-10-16 LAB — URINALYSIS, ROUTINE W REFLEX MICROSCOPIC
Bilirubin, UA: NEGATIVE
Glucose, UA: NEGATIVE
Ketones, UA: NEGATIVE
Leukocytes,UA: NEGATIVE
Nitrite, UA: NEGATIVE
Protein,UA: NEGATIVE
Specific Gravity, UA: 1.03 (ref 1.005–1.030)
Urobilinogen, Ur: 0.2 mg/dL (ref 0.2–1.0)
pH, UA: 6 (ref 5.0–7.5)

## 2023-10-16 LAB — MICROSCOPIC EXAMINATION
Bacteria, UA: NONE SEEN
WBC, UA: NONE SEEN /[HPF] (ref 0–5)

## 2024-02-12 ENCOUNTER — Other Ambulatory Visit: Payer: Self-pay

## 2024-02-12 ENCOUNTER — Encounter (HOSPITAL_BASED_OUTPATIENT_CLINIC_OR_DEPARTMENT_OTHER): Payer: Self-pay | Admitting: Orthopaedic Surgery

## 2024-02-12 NOTE — Progress Notes (Signed)
 Patient states he taking amoxicillin for a sinus infection. Reviewed with Dr. Denese Finn, ok to continue with surgery as scheduled 02/20/2024.

## 2024-02-13 NOTE — Progress Notes (Addendum)
 Surgical soap given with instructions, pt verbalized understanding. Benzoyl peroxide gel given with instructions, pt verbalized understanding.

## 2024-02-19 NOTE — H&P (Signed)
 PREOPERATIVE H&P  Chief Complaint: ROTATOR CUFF TEAR, BICEPS TENDONOSIS, LEFT SHOULDER  HPI: Matthew Bradford is a 63 y.o. male who is scheduled for, Procedure(s): ARTHROSCOPY, SHOULDER, WITH ROTATOR CUFF REPAIR TENODESIS, BICEPS DECOMPRESSION, SUBACROMIAL SPACE ARTHROSCOPY, SHOULDER WITH DEBRIDEMENT.   Patient has had pain in the left shoulder for quite some time. He has tried and failed injections. He continues to have pain in the left shoulder.   Symptoms are rated as moderate to severe, and have been worsening.  This is significantly impairing activities of daily living.    Please see clinic note for further details on this patient's care.    He has elected for surgical management.   Past Medical History:  Diagnosis Date   Bladder stone    Frequency of urination    Hematuria    History of cellulitis    03-2016  finger   History of kidney stones    Urgency of urination    Wears glasses    Past Surgical History:  Procedure Laterality Date   COLONOSCOPY N/A 06/09/2020   Procedure: COLONOSCOPY;  Surgeon: Ruby Corporal, MD;  [facility];  Service: Endoscopy;  Laterality: N/A;  930   CYSTOSCOPY WITH LITHOLAPAXY N/A 09/07/2016   Procedure: CYSTOSCOPY WITH LITHOLAPAXY;  Surgeon: Trent Frizzle, MD;  Location: [facility];  Service: Urology;  Laterality: N/A;   CYSTOSCOPY WITH LITHOLAPAXY N/A 05/20/2023   Procedure: CYSTOSCOPY WITH LITHOLAPAXY;  Surgeon: Trent Frizzle, MD;  Location: [facility];  Service: Urology;  Laterality: N/A;   CYSTOSCOPY WITH RETROGRADE PYELOGRAM, URETEROSCOPY AND STENT PLACEMENT Left 12/16/2020   Procedure: CYSTOSCOPY WITH RETROGRADE PYELOGRAM, URETEROSCOPY AND STENT PLACEMENT;  Surgeon: Roxane Copp, MD;  [facility];  Service: Urology;  Laterality: Left;  45 MINS   CYSTOSCOPY/RETROGRADE/URETEROSCOPY/STONE EXTRACTION WITH BASKET  1996   EXTRACORPOREAL SHOCK WAVE LITHOTRIPSY  11/29/2014    EXTRACORPOREAL SHOCK WAVE LITHOTRIPSY Left 05/15/2018   Procedure: LEFT EXTRACORPOREAL SHOCK WAVE LITHOTRIPSY (ESWL);  Surgeon: Andrez Banker, MD;  Location[facility];  Service: Urology;  Laterality: Left;   HOLMIUM LASER APPLICATION N/A 09/07/2016   Procedure: HOLMIUM LASER APPLICATION;  Surgeon: Trent Frizzle, MD;  Location: [facility];  Service: Urology;  Laterality: N/A;   HOLMIUM LASER APPLICATION Left 12/16/2020   Procedure: HOLMIUM LASER APPLICATION;  Surgeon: Roxane Copp, MD;  [facility];  Service: Urology;  Laterality: Left;   HOLMIUM LASER APPLICATION N/A 05/20/2023   Procedure: HOLMIUM LASER APPLICATION;  Surgeon: Trent Frizzle, MD;  Location: [facility];  Service: Urology;  Laterality: N/A;   I & D EXTREMITY Left 05/09/2016   Procedure: IRRIGATION AND DEBRIDEMENT LEFT INDEX FINGER;  Surgeon: Wes Hamman, MD;  Location: [facility];  Service: Orthopedics;  Laterality: Left;   POLYPECTOMY  06/09/2020   Procedure: POLYPECTOMY;  Surgeon: Ruby Corporal, MD;  [facility];  Service: Endoscopy;;   QUADRICEPS TENDON REPAIR Left 08/15/2021   Procedure: REPAIR QUADRICEP TENDON;  Surgeon: Saundra Curl, MD;  Location: [facility];  Service: Orthopedics;  Laterality: Left;   TONSILLECTOMY  1979   Social History   Socioeconomic History   Marital status: Married    Spouse name: Not on file   Number of children: Not on file   Years of education: Not on file   Highest education level: Not on file  Occupational History   Not on file  Tobacco Use   Smoking status:  Former    Current packs/day: 0.00    Types: Cigarettes    Start date: 12/24/1979    Quit date: 12/23/2009    Years since quitting: 14.1   Smokeless tobacco: Current    Types: Chew   Tobacco comments:    occasional chew tobacco  Vaping Use   Vaping status: Never Used  Substance and Sexual Activity   Alcohol use: No   Drug use: No   Sexual activity:  Not on file  Other Topics Concern   Not on file  Social History Narrative   Not on file   Social Drivers of Health   Financial Resource Strain: Not on file  Food Insecurity: Not on file  Transportation Needs: Not on file  Physical Activity: Not on file  Stress: Not on file  Social Connections: Not on file   History reviewed. No pertinent family history. Allergies  Allergen Reactions   Ibuprofen Other (See Comments)    "feels like gas build up in abdomen"   Nsaids     "feels like gas build up in abdomen"   Oxycodone      Nightmares, insomnia    Prior to Admission medications   Medication Sig Start Date End Date Taking? Authorizing Provider  amoxicillin (AMOXIL) 500 MG tablet Take 500 mg by mouth 2 (two) times daily.   Yes [provider]  allopurinol  (ZYLOPRIM ) 300 MG tablet Take 1 tablet (300 mg total) by mouth daily. Patient not taking: Reported on 10/15/2023 08/06/23   Trent Frizzle, MD  ondansetron  (ZOFRAN -ODT) 4 MG disintegrating tablet Take 1 tablet (4 mg total) by mouth every 8 (eight) hours as needed for nausea or vomiting. Patient not taking: Reported on 10/15/2023 08/15/21   Gawne, Meghan M, PA-C    ROS: All other systems have been reviewed and were otherwise negative with the exception of those mentioned in the HPI and as above.  Physical Exam: General: Alert, no acute distress Cardiovascular: No pedal edema Respiratory: No cyanosis, no use of accessory musculature GI: No organomegaly, abdomen is soft and non-tender Skin: No lesions in the area of chief complaint Neurologic: Sensation intact distally Psychiatric: Patient is competent for consent with normal mood and affect Lymphatic: No axillary or cervical lymphadenopathy  MUSCULOSKELETAL:  Left shoulder: active forward elevation to 170 degrees. 4/5 supraspinatus testing. Negative AC tenderness. Positive O'Briens. Positive impingement. Positive bicipital groove tenderness to palpation.    Imaging: MRI demonstrates a high grade partial thickness bursal sided cuff tear, biceps fluid, and a lateral hanging acromial spur  Assessment: ROTATOR CUFF TEAR, BICEPS TENDONOSIS, LEFT SHOULDER  Plan: Plan for Procedure(s): ARTHROSCOPY, SHOULDER, WITH ROTATOR CUFF REPAIR TENODESIS, BICEPS DECOMPRESSION, SUBACROMIAL SPACE ARTHROSCOPY, SHOULDER WITH DEBRIDEMENT  The risks benefits and alternatives were discussed with the patient including but not limited to the risks of nonoperative treatment, versus surgical intervention including infection, bleeding, nerve injury,  blood clots, cardiopulmonary complications, morbidity, mortality, among others, and they were willing to proceed.   The patient acknowledged the explanation, agreed to proceed with the plan and consent was signed.   Operative Plan: Left shoulder scope with SAD, BT, RCR Discharge Medications: standard DVT Prophylaxis: none Physical Therapy: outpatient PT Special Discharge needs: Sling. ICeMan    Adine Ahmadi, PA-C  02/19/2024 1:29 PM

## 2024-02-19 NOTE — Discharge Instructions (Signed)
 Grafton Lawrence MD, MPH Nicholas Bari, PA-C [facility] [address], [address] [phone] (tel)   [phone] (fax)   POST-OPERATIVE INSTRUCTIONS - SHOULDER ARTHROSCOPY  WOUND CARE You may remove the Operative Dressing on Post-Op Day #3 (72hrs after surgery).   Alternatively if you would like you can leave dressing on until follow-up if within 7-8 days but keep it dry. Leave steri-strips in place until they fall off on their own, usually 2 weeks postop. There may be a small amount of fluid/bleeding leaking at the surgical site.  This is normal; the shoulder is filled with fluid during the procedure and can leak for 24-48hrs after surgery.  You may change/reinforce the bandage as needed.  Use the Cryocuff or Ice as often as possible for the first 7 days, then as needed for pain relief. Always keep a towel, ACE wrap or other barrier between the cooling unit and your skin.  You may shower on Post-Op Day #3. Gently pat the area dry.  Do not soak the shoulder in water  or submerge it.  Keep incisions as dry as possible. Do not go swimming in the pool or ocean until 4 weeks after surgery or when otherwise instructed.    EXERCISES Wear the sling at all times  You may remove the sling for showering, but keep the arm across the chest or in a secondary sling.     It is normal for your fingers/hand to become more swollen after surgery and discolored from bruising.   This will resolve over the first few weeks usually after surgery. Please continue to ambulate and do not stay sitting or lying for too long.  Perform foot and wrist pumps to assist in circulation.  PHYSICAL THERAPY - You will begin physical therapy soon after surgery (unless otherwise specified) - Please call to set up an appointment, if you do not already have one  - Let our office if there are any issues with scheduling your therapy  - You have a physical therapy appointment scheduled at [facility] (across  the hall from our office) on 6/2   REGIONAL ANESTHESIA (NERVE BLOCKS) The anesthesia team may have performed a nerve block for you this is a great tool used to minimize pain.   The block may start wearing off overnight (between 8-24 hours postop) When the block wears off, your pain may go from nearly zero to the pain you would have had postop without the block. This is an abrupt transition but nothing dangerous is happening.   This can be a challenging period but utilize your as needed pain medications to try and manage this period. We suggest you use the pain medication the first night prior to going to bed, to ease this transition.  You may take an extra dose of narcotic when this happens if needed  POST-OP MEDICATIONS- Multimodal approach to pain control In general your pain will be controlled with a combination of substances.  Prescriptions unless otherwise discussed are electronically sent to your pharmacy.  This is a carefully made plan we use to minimize narcotic use.     Celebrex - Anti-inflammatory medication taken on a scheduled basis Acetaminophen  - Non-narcotic pain medicine taken on a scheduled basis  Hydrocodone -acetaminophen  (Norco) - This is a strong narcotic, to be used only on an "as needed" basis for SEVERE pain. Do not take more than 4,000 mg of Tylenol (acetaminophen ) daily Zofran  - take as needed for nausea   FOLLOW-UP If you develop a Fever (>=  101.5), Redness or Drainage from the surgical incision site, please call our office to arrange for an evaluation. Please call the office to schedule a follow-up appointment for your first post-operative appointment, 7-10 days post-operatively.    HELPFUL INFORMATION   You may be more comfortable sleeping in a semi-seated position the first few nights following surgery.  Keep a pillow propped under the elbow and forearm for comfort.  If you have a recliner type of chair it might be beneficial.  If not that is fine too, but it  would be helpful to sleep propped up with pillows behind your operated shoulder as well under your elbow and forearm.  This will reduce pulling on the suture lines.  When dressing, put your operative arm in the sleeve first.  When getting undressed, take your operative arm out last.  Loose fitting, button-down shirts are recommended.  Often in the first days after surgery you may be more comfortable keeping your operative arm under your shirt and not through the sleeve.  You may return to work/school in the next couple of days when you feel up to it.  Desk work and typing in the sling is fine.  We suggest you use the pain medication the first night prior to going to bed, in order to ease any pain when the anesthesia wears off. You should avoid taking pain medications on an empty stomach as it will make you nauseous.  You should wean off your narcotic medicines as soon as you are able.  Most patients will be off narcotics before their first postop appointment.   Do not drink alcoholic beverages or take illicit drugs when taking pain medications.  It is against the law to drive while taking narcotics.  In some states it is against the law to drive while your arm is in a sling.   Pain medication may make you constipated.  Below are a few solutions to try in this order: Decrease the amount of pain medication if you aren't having pain. Drink lots of decaffeinated fluids. Drink prune juice and/or eat dried prunes  If the first 3 don't work start with additional solutions Take Colace - an over-the-counter stool softener Take Senokot - an over-the-counter laxative Take Miralax - a stronger over-the-counter laxative  For more information including helpful videos and documents visit our website:   [email or web address]   Post Anesthesia Home Care Instructions  Activity: Get plenty of rest for the remainder of the day. A responsible individual must stay with you  for 24 hours following the procedure.  For the next 24 hours, DO NOT: -Drive a car -Advertising copywriter -Drink alcoholic beverages -Take any medication unless instructed by your physician -Make any legal decisions or sign important papers.  Meals: Start with liquid foods such as gelatin or soup. Progress to regular foods as tolerated. Avoid greasy, spicy, heavy foods. If nausea and/or vomiting occur, drink only clear liquids until the nausea and/or vomiting subsides. Call your physician if vomiting continues.  Special Instructions/Symptoms: Your throat may feel dry or sore from the anesthesia or the breathing tube placed in your throat during surgery. If this causes discomfort, gargle with warm salt water . The discomfort should disappear within 24 hours.      Regional Anesthesia Blocks  1. You may not be able to move or feel the "blocked" extremity after a regional anesthetic block. This may last may last from 3-48 hours after placement, but it will go away. The length of time depends on the  medication injected and your individual response to the medication. As the nerves start to wake up, you may experience tingling as the movement and feeling returns to your extremity. If the numbness and inability to move your extremity has not gone away after 48 hours, please call your surgeon.   2. The extremity that is blocked will need to be protected until the numbness is gone and the strength has returned. Because you cannot feel it, you will need to take extra care to avoid injury. Because it may be weak, you may have difficulty moving it or using it. You may not know what position it is in without looking at it while the block is in effect.  3. For blocks in the legs and feet, returning to weight bearing and walking needs to be done carefully. You will need to wait until the numbness is entirely gone and the strength has returned. You should be able to move your leg and foot normally before you try and  bear weight or walk. You will need someone to be with you when you first try to ensure you do not fall and possibly risk injury.  4. Bruising and tenderness at the needle site are common side effects and will resolve in a few days.  5. Persistent numbness or new problems with movement should be communicated to the surgeon or the [facility] [phone] [facility] [phone]).  Information for Discharge Teaching: EXPAREL (bupivacaine liposome injectable suspension)   Pain relief is important to your recovery. The goal is to control your pain so you can move easier and return to your normal activities as soon as possible after your procedure. Your physician may use several types of medicines to manage pain, swelling, and more.  Your surgeon or anesthesiologist gave you EXPAREL(bupivacaine) to help control your pain after surgery.  EXPAREL is a local anesthetic designed to release slowly over an extended period of time to provide pain relief by numbing the tissue around the surgical site. EXPAREL is designed to release pain medication over time and can control pain for up to 72 hours. Depending on how you respond to EXPAREL, you may require less pain medication during your recovery. EXPAREL can help reduce or eliminate the need for opioids during the first few days after surgery when pain relief is needed the most. EXPAREL is not an opioid and is not addictive. It does not cause sleepiness or sedation.   Important! A teal colored band has been placed on your arm with the date, time and amount of EXPAREL you have received. Please leave this armband in place for the full 96 hours following administration, and then you may remove the band. If you return to the hospital for any reason within 96 hours following the administration of EXPAREL, the armband provides important information that your health care providers to know, and alerts them that you have received this  anesthetic.    Possible side effects of EXPAREL: Temporary loss of sensation or ability to move in the area where medication was injected. Nausea, vomiting, constipation Rarely, numbness and tingling in your mouth or lips, lightheadedness, or anxiety may occur. Call your doctor right away if you think you may be experiencing any of these sensations, or if you have other questions regarding possible side effects.  Follow all other discharge instructions given to you by your surgeon or nurse. Eat a healthy diet and drink plenty of water  or other fluids.  Tylenol  can be taken  at 2:05pm if needed

## 2024-02-20 ENCOUNTER — Ambulatory Visit (HOSPITAL_BASED_OUTPATIENT_CLINIC_OR_DEPARTMENT_OTHER): Payer: PRIVATE HEALTH INSURANCE | Admitting: Anesthesiology

## 2024-02-20 ENCOUNTER — Other Ambulatory Visit: Payer: Self-pay

## 2024-02-20 ENCOUNTER — Encounter (HOSPITAL_BASED_OUTPATIENT_CLINIC_OR_DEPARTMENT_OTHER)
Admission: RE | Disposition: A | Discharge status: Home / Self Care | Payer: Self-pay | Source: Home / Self Care | Attending: Orthopaedic Surgery

## 2024-02-20 ENCOUNTER — Ambulatory Visit (HOSPITAL_BASED_OUTPATIENT_CLINIC_OR_DEPARTMENT_OTHER)
Admission: RE | Admit: 2024-02-20 | Discharge: 2024-02-20 | Disposition: A | Discharge status: Home / Self Care | Payer: PRIVATE HEALTH INSURANCE | Attending: Orthopaedic Surgery | Admitting: Orthopaedic Surgery

## 2024-02-20 ENCOUNTER — Encounter (HOSPITAL_BASED_OUTPATIENT_CLINIC_OR_DEPARTMENT_OTHER): Payer: Self-pay | Admitting: Orthopaedic Surgery

## 2024-02-20 DIAGNOSIS — M7542 Impingement syndrome of left shoulder: Secondary | ICD-10-CM | POA: Diagnosis not present

## 2024-02-20 DIAGNOSIS — X58XXXA Exposure to other specified factors, initial encounter: Secondary | ICD-10-CM | POA: Diagnosis not present

## 2024-02-20 DIAGNOSIS — M7522 Bicipital tendinitis, left shoulder: Secondary | ICD-10-CM

## 2024-02-20 DIAGNOSIS — S43432A Superior glenoid labrum lesion of left shoulder, initial encounter: Secondary | ICD-10-CM | POA: Insufficient documentation

## 2024-02-20 DIAGNOSIS — Z87891 Personal history of nicotine dependence: Secondary | ICD-10-CM | POA: Diagnosis not present

## 2024-02-20 DIAGNOSIS — M25812 Other specified joint disorders, left shoulder: Secondary | ICD-10-CM | POA: Insufficient documentation

## 2024-02-20 DIAGNOSIS — M75102 Unspecified rotator cuff tear or rupture of left shoulder, not specified as traumatic: Secondary | ICD-10-CM | POA: Insufficient documentation

## 2024-02-20 HISTORY — PX: SUBACROMIAL DECOMPRESSION: SHX5174

## 2024-02-20 HISTORY — PX: SHOULDER ARTHROSCOPY WITH ROTATOR CUFF REPAIR: SHX5685

## 2024-02-20 HISTORY — PX: BICEPT TENODESIS: SHX5116

## 2024-02-20 HISTORY — PX: POSTERIOR LUMBAR FUSION 2 WITH HARDWARE REMOVAL: SHX7297

## 2024-02-20 SURGERY — ARTHROSCOPY, SHOULDER, WITH ROTATOR CUFF REPAIR
Anesthesia: General | Site: Shoulder | Laterality: Left

## 2024-02-20 MED ORDER — FENTANYL CITRATE (PF) 100 MCG/2ML IJ SOLN
100.0000 ug | Freq: Once | INTRAMUSCULAR | Status: AC
  Administered 2024-02-20: 100 ug via INTRAVENOUS

## 2024-02-20 MED ORDER — SUGAMMADEX SODIUM 200 MG/2ML IV SOLN
INTRAVENOUS | Status: DC | PRN
  Administered 2024-02-20: 200 mg via INTRAVENOUS

## 2024-02-20 MED ORDER — EPHEDRINE SULFATE (PRESSORS) 50 MG/ML IJ SOLN
INTRAMUSCULAR | Status: DC | PRN
Start: 2024-02-20 — End: 2024-02-20
  Administered 2024-02-20: 10 mg via INTRAVENOUS

## 2024-02-20 MED ORDER — KETOROLAC TROMETHAMINE 30 MG/ML IJ SOLN
INTRAMUSCULAR | Status: DC | PRN
  Administered 2024-02-20: 40 mg via INTRAVENOUS

## 2024-02-20 MED ORDER — LACTATED RINGERS IV SOLN: INTRAVENOUS | Status: DC

## 2024-02-20 MED ORDER — MIDAZOLAM HCL 2 MG/2ML IJ SOLN
INTRAMUSCULAR | Status: AC
  Filled 2024-02-20: qty 2

## 2024-02-20 MED ORDER — MEPERIDINE HCL 25 MG/ML IJ SOLN: 6.2500 mg | INTRAMUSCULAR | Status: DC | PRN

## 2024-02-20 MED ORDER — ONDANSETRON HCL 4 MG/2ML IJ SOLN
INTRAMUSCULAR | Status: AC
  Filled 2024-02-20: qty 2

## 2024-02-20 MED ORDER — OXYCODONE HCL 5 MG/5ML PO SOLN: 5.0000 mg | Freq: Once | ORAL | Status: DC | PRN

## 2024-02-20 MED ORDER — MIDAZOLAM HCL 2 MG/2ML IJ SOLN
2.0000 mg | Freq: Once | INTRAMUSCULAR | Status: AC
  Administered 2024-02-20: 2 mg via INTRAVENOUS

## 2024-02-20 MED ORDER — BUPIVACAINE-EPINEPHRINE (PF) 0.5% -1:200000 IJ SOLN
INTRAMUSCULAR | Status: DC | PRN
Start: 2024-02-20 — End: 2024-02-20
  Administered 2024-02-20: 15 mL via PERINEURAL

## 2024-02-20 MED ORDER — TRANEXAMIC ACID-NACL 1000-0.7 MG/100ML-% IV SOLN
1000.0000 mg | INTRAVENOUS | Status: AC
  Administered 2024-02-20: 1000 mg via INTRAVENOUS

## 2024-02-20 MED ORDER — CEFAZOLIN SODIUM-DEXTROSE 2-4 GM/100ML-% IV SOLN
INTRAVENOUS | Status: AC
  Filled 2024-02-20: qty 100

## 2024-02-20 MED ORDER — ACETAMINOPHEN 500 MG PO TABS: 500.0000 mg | ORAL_TABLET | Freq: Three times a day (TID) | ORAL | 0 refills | Status: AC

## 2024-02-20 MED ORDER — MIDAZOLAM HCL 2 MG/2ML IJ SOLN: 0.5000 mg | Freq: Once | INTRAMUSCULAR | Status: DC | PRN

## 2024-02-20 MED ORDER — HYDROCODONE-ACETAMINOPHEN 5-325 MG PO TABS: 1.0000 | ORAL_TABLET | Freq: Four times a day (QID) | ORAL | 0 refills | Status: AC | PRN

## 2024-02-20 MED ORDER — EPHEDRINE 5 MG/ML INJ
INTRAVENOUS | Status: AC
  Filled 2024-02-20: qty 5

## 2024-02-20 MED ORDER — PROPOFOL 10 MG/ML IV BOLUS
INTRAVENOUS | Status: DC | PRN
Start: 2024-02-20 — End: 2024-02-20
  Administered 2024-02-20: 200 mg via INTRAVENOUS

## 2024-02-20 MED ORDER — OXYCODONE HCL 5 MG PO TABS: 5.0000 mg | ORAL_TABLET | Freq: Once | ORAL | Status: DC | PRN

## 2024-02-20 MED ORDER — SODIUM CHLORIDE 0.9 % IR SOLN
Status: DC | PRN
  Administered 2024-02-20: 6000 mL

## 2024-02-20 MED ORDER — CEFAZOLIN SODIUM-DEXTROSE 2-4 GM/100ML-% IV SOLN
2.0000 g | INTRAVENOUS | Status: AC
  Administered 2024-02-20: 2 g via INTRAVENOUS

## 2024-02-20 MED ORDER — ROCURONIUM BROMIDE 10 MG/ML (PF) SYRINGE
PREFILLED_SYRINGE | INTRAVENOUS | Status: AC
  Filled 2024-02-20: qty 10

## 2024-02-20 MED ORDER — HYDROMORPHONE HCL 1 MG/ML IJ SOLN: 0.2500 mg | INTRAMUSCULAR | Status: DC | PRN

## 2024-02-20 MED ORDER — GABAPENTIN 300 MG PO CAPS
ORAL_CAPSULE | ORAL | Status: AC
  Filled 2024-02-20: qty 1

## 2024-02-20 MED ORDER — ONDANSETRON HCL 4 MG/2ML IJ SOLN
INTRAMUSCULAR | Status: DC | PRN
  Administered 2024-02-20: 4 mg via INTRAVENOUS

## 2024-02-20 MED ORDER — PROPOFOL 10 MG/ML IV BOLUS
INTRAVENOUS | Status: AC
  Filled 2024-02-20: qty 20

## 2024-02-20 MED ORDER — DEXAMETHASONE SODIUM PHOSPHATE 10 MG/ML IJ SOLN
INTRAMUSCULAR | Status: AC
  Filled 2024-02-20: qty 1

## 2024-02-20 MED ORDER — ACETAMINOPHEN 500 MG PO TABS
1000.0000 mg | ORAL_TABLET | Freq: Once | ORAL | Status: AC
  Administered 2024-02-20: 1000 mg via ORAL

## 2024-02-20 MED ORDER — ONDANSETRON HCL 4 MG PO TABS: 4.0000 mg | ORAL_TABLET | Freq: Three times a day (TID) | ORAL | 0 refills | Status: AC | PRN

## 2024-02-20 MED ORDER — TRANEXAMIC ACID-NACL 1000-0.7 MG/100ML-% IV SOLN
INTRAVENOUS | Status: AC
Start: 2024-02-20 — End: ?
  Filled 2024-02-20: qty 100

## 2024-02-20 MED ORDER — DEXAMETHASONE SODIUM PHOSPHATE 4 MG/ML IJ SOLN
INTRAMUSCULAR | Status: DC | PRN
  Administered 2024-02-20: 5 mg via INTRAVENOUS

## 2024-02-20 MED ORDER — GABAPENTIN 300 MG PO CAPS
300.0000 mg | ORAL_CAPSULE | Freq: Once | ORAL | Status: AC
  Administered 2024-02-20: 300 mg via ORAL

## 2024-02-20 MED ORDER — FENTANYL CITRATE (PF) 100 MCG/2ML IJ SOLN
INTRAMUSCULAR | Status: AC
  Filled 2024-02-20: qty 2

## 2024-02-20 MED ORDER — ACETAMINOPHEN 500 MG PO TABS
ORAL_TABLET | ORAL | Status: AC
  Filled 2024-02-20: qty 2

## 2024-02-20 MED ORDER — ROCURONIUM BROMIDE 100 MG/10ML IV SOLN
INTRAVENOUS | Status: DC | PRN
  Administered 2024-02-20: 60 mg via INTRAVENOUS

## 2024-02-20 MED ORDER — CELECOXIB 100 MG PO CAPS: 100.0000 mg | ORAL_CAPSULE | Freq: Two times a day (BID) | ORAL | 0 refills | Status: AC

## 2024-02-20 MED ORDER — LIDOCAINE 2% (20 MG/ML) 5 ML SYRINGE
INTRAMUSCULAR | Status: AC
  Filled 2024-02-20: qty 5

## 2024-02-20 MED ORDER — TRANEXAMIC ACID-NACL 1000-0.7 MG/100ML-% IV SOLN
INTRAVENOUS | Status: AC
  Filled 2024-02-20: qty 100

## 2024-02-20 SURGICAL SUPPLY — 48 items
ANCHOR FBRTK 2.6 SUTURETAP 1.3 (Anchor) IMPLANT
ANCHOR SUT 1.8 FIBERTAK SB KL (Anchor) IMPLANT
ANCHOR SUT BIO SW 4.75X19.1 (Anchor) IMPLANT
BLADE EXCALIBUR 4.0X13 (MISCELLANEOUS) ×1 IMPLANT
BURR OVAL 8 FLU 4.0X13 (MISCELLANEOUS) ×1 IMPLANT
CANNULA 5.75X71 LONG (CANNULA) IMPLANT
CANNULA PASSPORT 5 (CANNULA) IMPLANT
CANNULA PASSPORT BUTTON 10-40 (CANNULA) IMPLANT
CANNULA TWIST IN 8.25X7CM (CANNULA) IMPLANT
CHLORAPREP W/TINT 26 (MISCELLANEOUS) ×1 IMPLANT
CLSR STERI-STRIP ANTIMIC 1/2X4 (GAUZE/BANDAGES/DRESSINGS) ×1 IMPLANT
COOLER ICEMAN CLASSIC (MISCELLANEOUS) ×1 IMPLANT
DRAPE IMP U-DRAPE 54X76 (DRAPES) ×1 IMPLANT
DRAPE INCISE IOBAN 66X45 STRL (DRAPES) IMPLANT
DRAPE SHOULDER BEACH CHAIR (DRAPES) ×1 IMPLANT
DW OUTFLOW CASSETTE/TUBE SET (MISCELLANEOUS) ×1 IMPLANT
GAUZE PAD ABD 8X10 STRL (GAUZE/BANDAGES/DRESSINGS) ×1 IMPLANT
GAUZE SPONGE 4X4 12PLY STRL (GAUZE/BANDAGES/DRESSINGS) ×1 IMPLANT
GLOVE BIO SURGEON STRL SZ 6.5 (GLOVE) ×1 IMPLANT
GLOVE BIOGEL PI IND STRL 6.5 (GLOVE) ×1 IMPLANT
GLOVE BIOGEL PI IND STRL 8 (GLOVE) ×1 IMPLANT
GLOVE ECLIPSE 8.0 STRL XLNG CF (GLOVE) ×1 IMPLANT
GOWN STRL REUS W/ TWL LRG LVL3 (GOWN DISPOSABLE) ×2 IMPLANT
GOWN STRL REUS W/TWL XL LVL3 (GOWN DISPOSABLE) ×1 IMPLANT
KIT STABILIZATION SHOULDER (MISCELLANEOUS) ×1 IMPLANT
KIT STR SPEAR 1.8 FBRTK DISP (KITS) IMPLANT
LASSO CRESCENT QUICKPASS (SUTURE) IMPLANT
MANIFOLD NEPTUNE II (INSTRUMENTS) ×1 IMPLANT
NDL HD SCORPION MEGA LOADER (NEEDLE) IMPLANT
NDL SAFETY ECLIPSE 18X1.5 (NEEDLE) ×1 IMPLANT
PACK ARTHROSCOPY DSU (CUSTOM PROCEDURE TRAY) ×1 IMPLANT
PACK BASIN DAY SURGERY FS (CUSTOM PROCEDURE TRAY) ×1 IMPLANT
PAD COLD SHLDR WRAP-ON (PAD) ×1 IMPLANT
RESTRAINT HEAD UNIVERSAL NS (MISCELLANEOUS) ×1 IMPLANT
SHEET MEDIUM DRAPE 40X70 STRL (DRAPES) ×1 IMPLANT
SLEEVE SCD COMPRESS KNEE MED (STOCKING) ×1 IMPLANT
SLING ARM FOAM STRAP LRG (SOFTGOODS) IMPLANT
SUT MNCRL AB 4-0 PS2 18 (SUTURE) ×1 IMPLANT
SUT PDS AB 0 CT 36 (SUTURE) IMPLANT
SUT TIGER TAPE 7 IN WHITE (SUTURE) IMPLANT
SUTURE FIBERWR #2 38 T-5 BLUE (SUTURE) IMPLANT
SUTURE TAPE TIGERLINK 1.3MM BL (SUTURE) IMPLANT
SYR 5ML LL (SYRINGE) ×1 IMPLANT
TAPE FIBER 2MM 7IN #2 BLUE (SUTURE) IMPLANT
TOWEL GREEN STERILE FF (TOWEL DISPOSABLE) ×2 IMPLANT
TUBE CONNECTING 20X1/4 (TUBING) ×1 IMPLANT
TUBING ARTHROSCOPY IRRIG 16FT (MISCELLANEOUS) ×1 IMPLANT
WAND ABLATOR APOLLO I90 (BUR) ×1 IMPLANT

## 2024-02-20 NOTE — Anesthesia Postprocedure Evaluation (Signed)
 Anesthesia Post Note  Patient: Matthew Bradford  Procedure(s) Performed: ARTHROSCOPY, SHOULDER, WITH ROTATOR CUFF REPAIR (Left: Shoulder) TENODESIS, BICEPS (Left: Shoulder) DECOMPRESSION, SUBACROMIAL SPACE (Left: Shoulder) ARTHROSCOPY, SHOULDER WITH DEBRIDEMENT (Left: Shoulder)     Patient location during evaluation: PACU Anesthesia Type: General Level of consciousness: awake and alert, oriented and patient cooperative Pain management: pain level controlled Vital Signs Assessment: post-procedure vital signs reviewed and stable Respiratory status: spontaneous breathing, nonlabored ventilation and respiratory function stable Cardiovascular status: blood pressure returned to baseline and stable Postop Assessment: no apparent nausea or vomiting, able to ambulate and adequate PO intake Anesthetic complications: no   No notable events documented.  Last Vitals:  Vitals:   02/20/24 1145 02/20/24 1200  BP: (!) 163/88 (!) 161/87  Pulse: 60 (!) 51  Resp: 17 18  Temp:  (!) 36.3 C  SpO2: 98% 93%    Last Pain:  Vitals:   02/20/24 1200  TempSrc:   PainSc: 0-No pain                 Kincade Granberg,E. Karyna Bessler

## 2024-02-20 NOTE — Progress Notes (Signed)
Assisted Dr. Carswell Jackson with left, interscalene , ultrasound guided block. Side rails up, monitors on throughout procedure. See vital signs in flow sheet. Tolerated Procedure well. 

## 2024-02-20 NOTE — Interval H&P Note (Signed)
 All questions answered, patient wants to proceed with procedure. ? ?

## 2024-02-20 NOTE — Op Note (Signed)
 Orthopaedic Surgery Operative Note (CSN: [id number])  Matthew Bradford  13-Jul-1961 Date of Surgery: 02/20/2024   DIAGNOSES: Left shoulder, acute on chronic rotator cuff tear, SLAP tear, biceps tendinitis, and subacromial impingement.  POST-OPERATIVE DIAGNOSIS: same  PROCEDURE: Arthroscopic extensive debridement - 29823 Subdeltoid Bursa, Supraspinatus Tendon, Anterior Labrum, Superior Labrum, and Posterior Labrum Arthroscopic subacromial decompression - 08657 Arthroscopic rotator cuff repair - 84696 Arthroscopic biceps tenodesis - 29528   OPERATIVE FINDING: Exam under anesthesia: Normal Articular space: Type II SLAP tear and anterior posterior labral tearing Chondral surfaces: Normal Biceps: Above Subscapularis: Intact  Supraspinatus: Complete tear there is an atypical tear with a delaminated layer of tissue.  We were able to use a combination of fiber tape and anchor based repair to perform a double row repair.  We used sutures from a double sliding 2.6 fiber tack in the deep layer and passed some of these through the superficial layer and augmented it with the fiber tape. Infraspinatus: Intact    Patient had a typical tear with a delaminated layer and poor tissue quality.  He has a high risk of retear despite having a relatively small tear secondary to the delaminated nature.  Post-operative plan: The patient will be non-weightbearing in a sling.  The patient will be discharged home.  DVT prophylaxis not indicated in ambulatory upper extremity patient without known risk factors.   Pain control with PRN pain medication preferring oral medicines.  Follow up plan will be scheduled in approximately 7 days for incision check and XR.  Surgeons:Primary: Micheline Ahr, MD Assistants:Caroline McBane, PA-C Location: [facility] OR ROOM 1 Anesthesia: General with Exparel interscalene block Antibiotics: Ancef  2 g Tourniquet time: None Estimated Blood Loss: Minimal Complications: None Specimens:  None Implants: Implant Name Type Inv. Item Serial No. Manufacturer Lot No. LRB No. Used Action  ANCHOR SUT 1.8 FIBERTAK SB KL - [id number] Anchor ANCHOR SUT 1.8 FIBERTAK SB KL  [employer] [id number] Left 1 Implanted  ANCHOR SUT 1.8 FIBERTAK SB KL - [id number] Anchor ANCHOR SUT 1.8 FIBERTAK SB KL  [employer] [id number] Left 1 Implanted  ANCHOR FBRTK 2.6 SUTURETAP 1.3 - [id number] Anchor ANCHOR FBRTK 2.6 SUTURETAP 1.3  [employer] [id number] Left 1 Implanted  ANCHOR SUT BIO SW 4.75X19.1 - [id number] Anchor ANCHOR SUT BIO SW 4.75X19.1  [employer] [id number] Left 1 Implanted    Indications for Surgery:   Matthew Bradford is a 63 y.o. male with continued shoulder pain refractory to nonoperative measures for extended period of time.    The risks and benefits were explained at length including but not limited to continued pain, cuff failure, biceps tenodesis failure, stiffness, need for further surgery and infection.   Procedure:   Patient was correctly identified in the preoperative holding area and operative site marked.  Patient brought to OR and positioned beachchair on an [place] table ensuring that all bony prominences were padded and the head was in an appropriate location.  Anesthesia was induced and the operative shoulder was prepped and draped in the usual sterile fashion.  Timeout was called preincision.  A standard posterior viewing portal was made after localizing the portal with a spinal needle.  An anterior accessory portal was also made.  After clearing the articular space the camera was positioned in the subacromial space.  Findings above.    Extensive debridement was performed of the anterior interval tissue, labral fraying and the bursa.  Glenoid bone, glenoid cartilage, humeral bone were all debrided.  Subacromial decompression: We made a  lateral portal with spinal needle guidance. We then proceeded to debride bursal tissue extensively with a shaver and arthrocare device. At that point we  continued to identify the borders of the acromion and identify the spur. We then carefully preserved the deltoid fascia and used a burr to convert the acromion to a Type 1 flat acromion without issue.  Biceps tenodesis: We marked the tendon and then performed a tenotomy and debridement of the stump in the articular space. We then identified the biceps tendon in its groove suprapec with the arthroscope in the lateral portal taking care to move from lateral to medial to avoid injury to the subscapularis. At that point we unroofed the tendon itself and mobilized it. An accessory anterior portal was made in line with the tendon and we grasped it from the anterior superior portal and worked from the accessory anterior portal. Two Fibertak 1.58mm knotless anchors were placed in the groove and the tendon was secured in a luggage loop style fashion with a pass of the limb of suture through the tendon using a scorpion device to avoid pull-through.  Repair was completed with good tension on the tendon.  Residual stump of the tendon was removed after being resected with a RF ablator.  Rotator cuff repair: We identified there was a atypical tear with a full-thickness component of the supraspinatus however there was a large delaminated superficial layer.  Were able to mobilize this and prepare the tuberosity for healing.  We placed a 2.6 fiber tack with sliding sutures at the medial aspect of the articular margin.  We passed these with a scorpion through the deep layer primarily.  The superficial layer had a fiber tape passed in all of the suture limbs were brought over to a 4.75 bio composite swivel lock 8 to 10 mm below the tuberosity.  Patient had good imbrication of tissue to bone.  The incisions were closed with absorbable monocryl and steri strips.  A sterile dressing was placed along with a sling. The patient was awoken from general anesthesia and taken to the PACU in stable condition without complication.    Nicholas Bari, PA-C, present and scrubbed throughout the case, critical for completion in a timely fashion, and for retraction, instrumentation, closure.

## 2024-02-20 NOTE — Anesthesia Procedure Notes (Signed)
 Procedure Name: Intubation Date/Time: 02/20/2024 10:09 AM  Performed by: Lucky Sable, CRNAPre-anesthesia Checklist: Patient identified, Emergency Drugs available, Suction available, Patient being monitored and Timeout performed Patient Re-evaluated:Patient Re-evaluated prior to induction Oxygen Delivery Method: Circle system utilized Preoxygenation: Pre-oxygenation with 100% oxygen Induction Type: IV induction Ventilation: Mask ventilation without difficulty Laryngoscope Size: Mac and 4 Grade View: Grade II Tube type: Oral Tube size: 7.5 mm Number of attempts: 1 Airway Equipment and Method: Stylet and Oral airway Placement Confirmation: ETT inserted through vocal cords under direct vision, positive ETCO2, breath sounds checked- equal and bilateral and CO2 detector Secured at: 23 cm Tube secured with: Tape Dental Injury: Teeth and Oropharynx as per pre-operative assessment

## 2024-02-20 NOTE — Anesthesia Preprocedure Evaluation (Addendum)
 Anesthesia Evaluation  Patient identified by MRN, date of birth, ID band Patient awake    Reviewed: Allergy & Precautions, NPO status , Patient's Chart, lab work & pertinent test results  History of Anesthesia Complications Negative for: history of anesthetic complications  Airway Mallampati: II  TM Distance: >3 FB Neck ROM: Full    Dental  (+) Dental Advisory Given   Pulmonary former smoker   breath sounds clear to auscultation       Cardiovascular (-) angina negative cardio ROS  Rhythm:Regular Rate:Normal     Neuro/Psych negative neurological ROS     GI/Hepatic negative GI ROS, Neg liver ROS,,,  Endo/Other  negative endocrine ROS    Renal/GU H/o stones     Musculoskeletal   Abdominal   Peds  Hematology negative hematology ROS (+)   Anesthesia Other Findings   Reproductive/Obstetrics                             Anesthesia Physical Anesthesia Plan  ASA: 2  Anesthesia Plan: General   Post-op Pain Management: Tylenol  PO (pre-op)* and Regional block*   Induction: Intravenous  PONV Risk Score and Plan: 2 and Ondansetron  and Dexamethasone   Airway Management Planned: Oral ETT  Additional Equipment: None  Intra-op Plan:   Post-operative Plan: Extubation in OR  Informed Consent: I have reviewed the patients History and Physical, chart, labs and discussed the procedure including the risks, benefits and alternatives for the proposed anesthesia with the patient or authorized representative who has indicated his/her understanding and acceptance.     Dental advisory given  Plan Discussed with: CRNA and Surgeon  Anesthesia Plan Comments: (Plan routine monitors, GETA with interscalene block for post op analgesia)        Anesthesia Quick Evaluation

## 2024-02-20 NOTE — Transfer of Care (Signed)
 Immediate Anesthesia Transfer of Care Note  Patient: Matthew Bradford  Procedure(s) Performed: Procedure(s) (LRB): ARTHROSCOPY, SHOULDER, WITH ROTATOR CUFF REPAIR (Left) TENODESIS, BICEPS (Left) DECOMPRESSION, SUBACROMIAL SPACE (Left) ARTHROSCOPY, SHOULDER WITH DEBRIDEMENT (Left)  Patient Location: PACU  Anesthesia Type: GA  Level of Consciousness: awake, sedated, patient cooperative and responds to stimulation, sleepy stable   Airway & Oxygen Therapy: Patient Spontanous Breathing and Patient connected to [place] oxygen  Post-op Assessment: Report given to PACU RN, Post -op Vital signs reviewed and stable and Patient sleepy   Post vital signs: Reviewed and stable  Complications: No apparent anesthesia complications

## 2024-02-20 NOTE — Anesthesia Procedure Notes (Signed)
 Anesthesia Regional Block: Interscalene brachial plexus block   Pre-Anesthetic Checklist: , timeout performed,  Correct Patient, Correct Site, Correct Laterality,  Correct Procedure, Correct Position, site marked,  Risks and benefits discussed,  Surgical consent,  Pre-op evaluation,  At surgeon's request and post-op pain management  Laterality: Left and Upper  Prep: chloraprep       Needles:  Injection technique: Single-shot  Needle Type: Echogenic Needle     Needle Length: 9cm  Needle Gauge: 21     Additional Needles:   Procedures:,,,, ultrasound used (permanent image in chart),,    Narrative:  Start time: 02/20/2024 9:09 AM End time: 02/20/2024 9:15 AM Injection made incrementally with aspirations every 5 mL.  Performed by: Personally  Anesthesiologist: Jonne Netters, MD  Additional Notes: Pt identified in Holding room.  Monitors applied. Working IV access confirmed. Timeout, Sterile prep L clavicle and neck.  #21ga ECHOgenic Arrow block needle to interscalene brachial plexus with US  guidance.  15cc 0.5% Bupivacaine 1:200k epi, Exparel injected incrementally after negative test dose.  Patient asymptomatic, VSS, no heme aspirated, tolerated well.   Fay Hoop, MD

## 2024-02-21 ENCOUNTER — Encounter (HOSPITAL_BASED_OUTPATIENT_CLINIC_OR_DEPARTMENT_OTHER): Payer: Self-pay | Admitting: Orthopaedic Surgery

## 2024-09-25 ENCOUNTER — Ambulatory Visit
Admission: RE | Admit: 2024-09-25 | Discharge: 2024-09-25 | Disposition: A | Discharge status: Home / Self Care | Payer: PRIVATE HEALTH INSURANCE | Source: Ambulatory Visit | Attending: Urology | Admitting: Urology

## 2024-09-25 DIAGNOSIS — N200 Calculus of kidney: Secondary | ICD-10-CM

## 2024-09-29 ENCOUNTER — Encounter (HOSPITAL_BASED_OUTPATIENT_CLINIC_OR_DEPARTMENT_OTHER): Payer: Self-pay | Admitting: Orthopaedic Surgery

## 2024-10-21 NOTE — Progress Notes (Signed)
 "    Impression/Assessment:  History of urolithiasis with cystolitholapaxy of a large bladder stone in August 2024.  On recent renal ultrasound there is evidence of a left renal calculus, 10 mm in size.  Uric acid urolithiasis.  On allopurinol  now.  He had a low urine volume as well as low citrate    Plan:  1.  Continue to increase fluids, add citrus juice  2.  Allopurinol  refilled  3.  I will have him come back in 6 months following renal ultrasound and KUB to see Dr. Sherrilee  History of Present Illness: Matthew Bradford is here for f/u of urolithiasis.  Stone analysis--100% uric acid.  24 hr urine--  -Urine calcium 106 mg  -citrate 163  -uric acid 239  -volume 620  He is now on allopurinol  300 mg daily  2.3.2026: Here today to recheck.  Is on allopurinol .  Does try to push fluids.  No stone episodes over the past year.  Recent renal ultrasound revealed no bladder abnormality.  No right renal stones, no hydronephrosis.  10 mm hyperechoic area in mid left kidney.     Past Medical History:  Diagnosis Date   Bladder stone    Frequency of urination    Hematuria    History of cellulitis    03-2016  finger   History of kidney stones    Urgency of urination    Wears glasses     Past Surgical History:  Procedure Laterality Date   BICEPT TENODESIS Left 02/20/2024   Procedure: TENODESIS, BICEPS;  Surgeon: Cristy Bonner DASEN, MD;  Location: [facility];  Service: Orthopedics;  Laterality: Left;   COLONOSCOPY N/A 06/09/2020   Procedure: COLONOSCOPY;  Surgeon: Golda Claudis PENNER, MD;  Location: [facility];  Service: Endoscopy;  Laterality: N/A;  930   CYSTOSCOPY WITH LITHOLAPAXY N/A 09/07/2016   Procedure: CYSTOSCOPY WITH LITHOLAPAXY;  Surgeon: Garnette Shack, MD;  Location: [facility];  Service: Urology;  Laterality: N/A;   CYSTOSCOPY WITH LITHOLAPAXY N/A 05/20/2023   Procedure: CYSTOSCOPY WITH LITHOLAPAXY;  Surgeon: Shack Garnette, MD;  Location:  [facility];  Service: Urology;  Laterality: N/A;   CYSTOSCOPY WITH RETROGRADE PYELOGRAM, URETEROSCOPY AND STENT PLACEMENT Left 12/16/2020   Procedure: CYSTOSCOPY WITH RETROGRADE PYELOGRAM, URETEROSCOPY AND STENT PLACEMENT;  Surgeon: Elisabeth Valli BIRCH, MD;  Location: [facility];  Service: Urology;  Laterality: Left;  45 MINS   CYSTOSCOPY/RETROGRADE/URETEROSCOPY/STONE EXTRACTION WITH BASKET  1996   EXTRACORPOREAL SHOCK WAVE LITHOTRIPSY  11/29/2014   EXTRACORPOREAL SHOCK WAVE LITHOTRIPSY Left 05/15/2018   Procedure: LEFT EXTRACORPOREAL SHOCK WAVE LITHOTRIPSY (ESWL);  Surgeon: Cam Morene ORN, MD;  Location: [facility];  Service: Urology;  Laterality: Left;   HOLMIUM LASER APPLICATION N/A 09/07/2016   Procedure: HOLMIUM LASER APPLICATION;  Surgeon: Garnette Shack, MD;  Location: [facility];  Service: Urology;  Laterality: N/A;   HOLMIUM LASER APPLICATION Left 12/16/2020   Procedure: HOLMIUM LASER APPLICATION;  Surgeon: Elisabeth Valli BIRCH, MD;  Location: [facility];  Service: Urology;  Laterality: Left;   HOLMIUM LASER APPLICATION N/A 05/20/2023   Procedure: HOLMIUM LASER APPLICATION;  Surgeon: Shack Garnette, MD;  Location: [facility]  Service: Urology;  Laterality: N/A;   I & D EXTREMITY Left 05/09/2016   Procedure: IRRIGATION AND DEBRIDEMENT LEFT INDEX FINGER;  Surgeon: Kay CHRISTELLA Cummins, MD;  Location: [facility];  Service: Orthopedics;  Laterality: Left;   POLYPECTOMY  06/09/2020   Procedure: POLYPECTOMY;  Surgeon: Golda Claudis PENNER,  MD;  Location: [facility];  Service: Endoscopy;;   QUADRICEPS TENDON REPAIR Left 08/15/2021   Procedure: REPAIR QUADRICEP TENDON;  Surgeon: Beverley Evalene BIRCH, MD;  [facility];  Service: Orthopedics;  Laterality: Left;   SHOULDER ARTHROSCOPY WITH ROTATOR CUFF REPAIR Left 02/20/2024   Procedure: ARTHROSCOPY, SHOULDER, WITH ROTATOR CUFF REPAIR;  Surgeon: Cristy Bonner DASEN, MD;  Location: [facility];   Service: Orthopedics;  Laterality: Left;   SUBACROMIAL DECOMPRESSION Left 02/20/2024   Procedure: DECOMPRESSION, SUBACROMIAL SPACE;  Surgeon: Cristy Bonner DASEN, MD;  Location: [facility];  Service: Orthopedics;  Laterality: Left;   TONSILLECTOMY  1979    Home Medications:  Allergies as of 10/27/2024       Reactions   Ibuprofen Other (See Comments)   feels like gas build up in abdomen   Nsaids    feels like gas build up in abdomen   Oxycodone     Nightmares, insomnia         Medication List        Accurate as of October 21, 2024  2:51 PM. If you have any questions, ask your nurse or doctor.          allopurinol  300 MG tablet Commonly known as: Zyloprim  Take 1 tablet (300 mg total) by mouth daily.   amoxicillin 500 MG tablet Commonly known as: AMOXIL Take 500 mg by mouth 2 (two) times daily.   HYDROcodone -acetaminophen  5-325 MG tablet Commonly known as: NORCO/VICODIN Take 1 tablet by mouth every 6 (six) hours as needed for severe pain (pain score 7-10).        Allergies:  Allergies  Allergen Reactions   Ibuprofen Other (See Comments)    feels like gas build up in abdomen   Nsaids     feels like gas build up in abdomen   Oxycodone      Nightmares, insomnia     No family history on file.  Social History:  reports that he quit smoking about 14 years ago. His smoking use included cigarettes. He started smoking about 44 years ago. His smokeless tobacco use includes chew. He reports that he does not drink alcohol and does not use drugs.   I have reviewed prior pt notes  I have reviewed urinalysis results--3-10 red cells, otherwise clear.  pH 5.5  I have independently reviewed prior imaging--CT images from 2024 revealed 2-3 small left renal calculi  I have reviewed prior stone analysis results      "

## 2024-10-27 ENCOUNTER — Ambulatory Visit: Payer: PRIVATE HEALTH INSURANCE | Admitting: Urology

## 2024-10-27 VITALS — BP 129/71 | HR 59

## 2024-10-27 DIAGNOSIS — N200 Calculus of kidney: Secondary | ICD-10-CM | POA: Diagnosis not present

## 2024-10-27 DIAGNOSIS — Z87442 Personal history of urinary calculi: Secondary | ICD-10-CM

## 2024-10-27 DIAGNOSIS — N210 Calculus in bladder: Secondary | ICD-10-CM

## 2024-10-27 MED ORDER — ALLOPURINOL 300 MG PO TABS: 300.0000 mg | ORAL_TABLET | Freq: Every day | ORAL | 3 refills | Status: AC

## 2024-10-28 LAB — URINALYSIS, ROUTINE W REFLEX MICROSCOPIC
Bilirubin, UA: NEGATIVE
Glucose, UA: NEGATIVE
Ketones, UA: NEGATIVE
Leukocytes,UA: NEGATIVE
Nitrite, UA: NEGATIVE
Protein,UA: NEGATIVE
Specific Gravity, UA: 1.03 (ref 1.005–1.030)
Urobilinogen, Ur: 0.2 mg/dL (ref 0.2–1.0)
pH, UA: 5.5 (ref 5.0–7.5)

## 2024-10-28 LAB — MICROSCOPIC EXAMINATION: Bacteria, UA: NONE SEEN

## 2024-10-29 ENCOUNTER — Other Ambulatory Visit: Payer: Self-pay | Admitting: Urology

## 2024-10-29 DIAGNOSIS — N200 Calculus of kidney: Secondary | ICD-10-CM

## 2025-05-12 ENCOUNTER — Ambulatory Visit: Payer: PRIVATE HEALTH INSURANCE | Admitting: Urology
